# Patient Record
Sex: Male | Born: 1955 | Race: White | Hispanic: No | Marital: Married
Health system: Midwestern US, Community
[De-identification: ages and names within clinical notes are randomized; demographics above are authoritative.]

## PROBLEM LIST (undated history)

## (undated) DIAGNOSIS — Z87438 Personal history of other diseases of male genital organs: Secondary | ICD-10-CM

## (undated) DIAGNOSIS — Z789 Other specified health status: Secondary | ICD-10-CM

## (undated) DIAGNOSIS — D171 Benign lipomatous neoplasm of skin and subcutaneous tissue of trunk: Secondary | ICD-10-CM

## (undated) HISTORY — PX: WRIST SURGERY: SHX841

## (undated) HISTORY — PX: KNEE SURGERY: SHX244

## (undated) HISTORY — PX: SHOULDER SURGERY: SHX246

## (undated) HISTORY — PX: TRANSURETHRAL RESECTION OF PROSTATE: SHX73

## (undated) HISTORY — PX: NO PAST SURGERIES: SHX2092

---

## 2017-04-02 ENCOUNTER — Emergency Department (HOSPITAL_COMMUNITY)
Admission: EM | Admit: 2017-04-02 | Discharge: 2017-04-02 | Disposition: A | Discharge status: Home / Self Care | Payer: Self-pay | Attending: Emergency Medicine | Admitting: Emergency Medicine

## 2017-04-02 ENCOUNTER — Encounter (HOSPITAL_COMMUNITY): Payer: Self-pay

## 2017-04-02 DIAGNOSIS — Z79899 Other long term (current) drug therapy: Secondary | ICD-10-CM | POA: Insufficient documentation

## 2017-04-02 DIAGNOSIS — M545 Low back pain, unspecified: Secondary | ICD-10-CM

## 2017-04-02 MED ORDER — CYCLOBENZAPRINE HCL 10 MG PO TABS: 10.0000 mg | ORAL_TABLET | Freq: Two times a day (BID) | ORAL | 0 refills | Status: DC | PRN

## 2017-04-02 NOTE — Discharge Instructions (Signed)
Please take Flexeril as needed for back pain and muscle spasm. Please take Tylenol at home for pain relief. Please follow up with a primary care provider using the financial resource guide attached. Please also follow up with neurosurgery regarding today's visit.  Get help right away if: You have weakness or numbness in one or both of your legs or feet. You have trouble controlling your bladder or your bowels. You have nausea or vomiting. You have pain in your abdomen. You have shortness of breath or you faint.

## 2017-04-02 NOTE — ED Triage Notes (Signed)
Pt states he has had ongoing right lower back x 2 years; pt states he has a "growth" there which has been growing;Pt states he was dx in [facility] and records at [facility]. pt states he has not  Had it checked in the past year; pt states sharp pain that does not allow him to get any sleep or sleep on the right side. Pt states pain 9/10 on arrival. Pt able to ambulate to triage; Pt a&ox 4

## 2017-04-02 NOTE — ED Notes (Signed)
PA at bedside.

## 2017-04-02 NOTE — ED Notes (Signed)
Patient able to ambulate independently  

## 2017-04-02 NOTE — ED Provider Notes (Signed)
[place] DEPT Provider Note   CSN: [id number] Arrival date & time: 04/02/17  1942  By signing my name below, I, Margit Banda, attest that this documentation has been prepared under the direction and in the presence of Berea Majkowski, [place]. Electronically Signed: Margit Banda, ED Scribe. 04/02/17. 9:56 PM.  History   Chief Complaint No chief complaint on file.   HPI Douglas Lewis is a 61 y.o. male with a PMHx chronic back pain who presents to the Emergency Department complaining of gradually worsening, sharp, constant, right lower back pain for the last couple of days. He rates pain a 9/10 severity and states it is deep and burning. Pt reports a growth on his back that has been increasing in size. It was first dx in [facility] ~ 2 years ago. He last had it examined over a year ago. Associated sx include sleep disturbance and right hand numbness. Took aleve with no relief. Movement exacerbates his pain. No hx of IV drug use. 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 No recent back injuries. Doesn't smoke. Pt denies fever, chills, nausea, vomiting, diarrhea, pain radiation, visual disturbance, dysuria, hematuria, saddle anesthesia, and bladder and bowel incontinence.  The history is provided by the patient. No language interpreter was used.    History reviewed. No pertinent past medical history.  There are no active problems to display for this patient.   History reviewed. No pertinent surgical history.     Home Medications    Prior to Admission medications   Medication Sig Start Date End Date Taking? Authorizing Provider  cyclobenzaprine (FLEXERIL) 10 MG tablet Take 1 tablet (10 mg total) by mouth 2 (two) times daily as needed for muscle spasms. 04/02/17   Bettey Costa, PA    Family History No family history on file.  Social History Social History  Substance Use Topics  . Smoking status: Never Smoker  . Smokeless tobacco:  Not on file  . Alcohol use No     Allergies   Patient has no allergy information on record.   Review of Systems Review of Systems  Constitutional: Negative for chills and fever.  Eyes: Negative for visual disturbance.  Gastrointestinal: Negative for diarrhea, nausea and vomiting.  Genitourinary: Negative for dysuria and hematuria.       - saddle anesthesia. - bladder and bowel incontinence.  Musculoskeletal: Positive for back pain.  Neurological: Positive for numbness (right hand).  Psychiatric/Behavioral: Positive for sleep disturbance.    Physical Exam Updated Vital Signs BP (!) 150/87 (BP Location: Right Arm)   Pulse 76   Temp 97.7 F (36.5 C) (Oral)   Resp 18   SpO2 94%   Physical Exam  Constitutional: He is oriented to person, place, and time. He appears well-developed and well-nourished.  Well appearing  HENT:  Head: Normocephalic and atraumatic.  Nose: Nose normal.  Mouth/Throat: Oropharynx is clear and moist.  Eyes: EOM are normal. Pupils are equal, round, and reactive to light.  Neck: Normal range of motion.  Normal ROM, no neck tenderness. No nuchal rigidity  Cardiovascular: Normal rate, normal heart sounds and intact distal pulses.   Pulmonary/Chest: Effort normal and breath sounds normal. No respiratory distress.  Normal work of breathing  Abdominal: Soft. There is no tenderness. There is no rebound and no guarding.  Soft and nontender. No rebound or guarding. No pulsatile mass noted.   Musculoskeletal: He exhibits tenderness. He exhibits no deformity.  There is  tenderness to right lower back. No midline cervical, thoracic, or lumbar tenderness. Good ROM of spine. No deformity. No obvious wound, redness, or swelling noted.  No mass palpated.   Neurological: He is alert and oriented to person, place, and time.  Cranial Nerves:  III,IV, VI: ptosis not present, extra-ocular movements intact bilaterally, direct and consensual pupillary light reflexes intact  bilaterally V: facial sensation, jaw opening, and bite strength equal bilaterally VII: eyebrow raise, eyelid close, smile, frown, pucker equal bilaterally VIII: hearing grossly normal bilaterally  IX,X: palate elevation and swallowing intact XI: bilateral shoulder shrug and lateral head rotation equal and strong XII: midline tongue extension  Negative pronator drift, negative Romberg, negative RAM's, negative heel-to-shin, negative finger to nose.    Sensory intact.  Muscle strength 5/5 Patient able to ambulate without difficulty.   SLR Right -  negative SLR Left -  negative  Skin: Skin is warm. Capillary refill takes less than 2 seconds.  Psychiatric: He has a normal mood and affect. His behavior is normal.  Nursing note and vitals reviewed.    ED Treatments / Results  DIAGNOSTIC STUDIES: Oxygen Saturation is 100% on RA, normal by my interpretation.   COORDINATION OF CARE: 9:56 PM-Discussed next steps with pt which includes following up with a PCP. He is also told to take a muscle relaxer and ibuprofen or aleve. Pt verbalized understanding and is agreeable with the plan.    Labs (all labs ordered are listed, but only abnormal results are displayed) Labs Reviewed - No data to display  EKG  EKG Interpretation None       Radiology No results found.  Procedures Procedures (including critical care time)  Medications Ordered in ED Medications - No data to display   Initial Impression / Assessment and Plan / ED Course  I have reviewed the triage vital signs and the nursing notes.  Pertinent labs & imaging results that were available during my care of the patient were reviewed by me and considered in my medical decision making (see chart for details).    Patient with back pain.  No neurological deficits and normal neuro exam.  Patient is ambulatory.  No loss of bowel or bladder control.  No concern for cauda equina.  No fever, night sweats, weight loss, h/o cancer,  IVDA, no recent procedure to back. No urinary symptoms suggestive of UTI.  Supportive care and return precaution discussed. Appears safe for discharge at this time. Follow up as indicated in discharge paperwork.    Final Clinical Impressions(s) / ED Diagnoses   Final diagnoses:  Acute right-sided low back pain without sciatica    New Prescriptions New Prescriptions   CYCLOBENZAPRINE (FLEXERIL) 10 MG TABLET    Take 1 tablet (10 mg total) by mouth 2 (two) times daily as needed for muscle spasms.   I personally performed the services described in this documentation, which was scribed in my presence. The recorded information has been reviewed and is accurate.    Flonnie Overman [place], [place] 04/02/17 2238    Drenda Freeze, MD 04/02/17 [phone]

## 2017-06-28 DIAGNOSIS — M5417 Radiculopathy, lumbosacral region: Secondary | ICD-10-CM | POA: Insufficient documentation

## 2017-06-29 ENCOUNTER — Emergency Department (HOSPITAL_COMMUNITY)
Admission: EM | Admit: 2017-06-29 | Discharge: 2017-06-29 | Disposition: A | Discharge status: Home / Self Care | Payer: Self-pay | Attending: Emergency Medicine | Admitting: Emergency Medicine

## 2017-06-29 ENCOUNTER — Emergency Department (HOSPITAL_COMMUNITY): Payer: Self-pay

## 2017-06-29 DIAGNOSIS — M5417 Radiculopathy, lumbosacral region: Secondary | ICD-10-CM

## 2017-06-29 MED ORDER — FENTANYL CITRATE (PF) 100 MCG/2ML IJ SOLN
50.0000 ug | Freq: Once | INTRAMUSCULAR | Status: AC
  Administered 2017-06-29: 50 ug via INTRAVENOUS
  Filled 2017-06-29: qty 2

## 2017-06-29 MED ORDER — CYCLOBENZAPRINE HCL 10 MG PO TABS: 10.0000 mg | ORAL_TABLET | Freq: Three times a day (TID) | ORAL | 0 refills | Status: DC | PRN

## 2017-06-29 MED ORDER — DEXAMETHASONE SODIUM PHOSPHATE 10 MG/ML IJ SOLN
10.0000 mg | Freq: Once | INTRAMUSCULAR | Status: AC
  Administered 2017-06-29: 10 mg via INTRAVENOUS
  Filled 2017-06-29: qty 1

## 2017-06-29 MED ORDER — OXYCODONE-ACETAMINOPHEN 5-325 MG PO TABS
1.0000 | ORAL_TABLET | Freq: Once | ORAL | Status: AC
  Administered 2017-06-29: 1 via ORAL
  Filled 2017-06-29: qty 1

## 2017-06-29 MED ORDER — OXYCODONE-ACETAMINOPHEN 5-325 MG PO TABS: 1.0000 | ORAL_TABLET | Freq: Three times a day (TID) | ORAL | 0 refills | Status: DC | PRN

## 2017-06-29 MED ORDER — KETOROLAC TROMETHAMINE 15 MG/ML IJ SOLN
15.0000 mg | Freq: Once | INTRAMUSCULAR | Status: AC
  Administered 2017-06-29: 15 mg via INTRAVENOUS
  Filled 2017-06-29: qty 1

## 2017-06-29 NOTE — Discharge Instructions (Signed)

## 2017-06-29 NOTE — ED Provider Notes (Signed)
[place] DEPT Provider Note   CSN: [id number] Arrival date & time: 06/28/17  2321     History   Chief Complaint Chief Complaint  Patient presents with  . Back Pain    HPI Drystan Reader is a 61 y.o. male.  The history is provided by the patient.  Back Pain   This is a new problem. The current episode started 3 to 5 hours ago. The problem occurs constantly. The problem has been gradually worsening. The pain is associated with lifting heavy objects. The pain is present in the lumbar spine. The pain radiates to the right thigh. The pain is severe. The symptoms are aggravated by certain positions. Pertinent negatives include no chest pain, no fever, no abdominal pain, no bowel incontinence, no bladder incontinence and no weakness. He has tried bed rest for the symptoms. The treatment provided no relief.  pt reports lifting trailer and had immediate onset of low back pain that radiates into right thigh He has had this previously No previous back surgery   PMH - none Soc hx - nonsmoker  Home Medications    Prior to Admission medications   Medication Sig Start Date End Date Taking? Authorizing Provider  cyclobenzaprine (FLEXERIL) 10 MG tablet Take 1 tablet (10 mg total) by mouth 2 (two) times daily as needed for muscle spasms. 04/02/17   Bettey Costa, PA    Family History No family history on file.  Social History Social History  Substance Use Topics  . Smoking status: Never Smoker  . Smokeless tobacco: Not on file  . Alcohol use No     Allergies   Patient has no known allergies.   Review of Systems Review of Systems  Constitutional: Negative for fever.  Cardiovascular: Negative for chest pain.  Gastrointestinal: Negative for abdominal pain and bowel incontinence.  Genitourinary: Negative for bladder incontinence.  Musculoskeletal: Positive for back pain.  Neurological: Negative for weakness.  All other systems reviewed and are  negative.    Physical Exam Updated Vital Signs BP (!) 145/101 (BP Location: Right Arm)   Pulse 71   Temp 98.8 F (37.1 C) (Oral)   Resp 18   Physical Exam  CONSTITUTIONAL: Well developed/well nourished HEAD: Normocephalic/atraumatic EYES: EOMI/PERRL ENMT: Mucous membranes moist NECK: supple no meningeal signs SPINE/BACK:entire spine nontender , lumbar paraspinal tenderness, No bruising/crepitance/stepoffs noted to spine CV: S1/S2 noted, no murmurs/rubs/gallops noted LUNGS: Lungs are clear to auscultation bilaterally, no apparent distress ABDOMEN: soft, nontender, no rebound or guarding GU:no cva tenderness NEURO: Awake/alert, equal motor 5/5 strength noted with the following: hip flexion/knee flexion/extension, foot dorsi/plantar flexion, great toe extension intact bilaterally,  no sensory deficit in any dermatome.  EXTREMITIES: pulses normal, full ROM SKIN: warm, color normal PSYCH: no abnormalities of mood noted, alert and oriented to situation   ED Treatments / Results  Labs (all labs ordered are listed, but only abnormal results are displayed) Labs Reviewed - No data to display  EKG  EKG Interpretation None       Radiology Dg Lumbar Spine Complete  Result Date: 06/29/2017 CLINICAL DATA:  Lumbosacral back pain after injury lifting a trailer onto a hitch. Pain radiates down right leg. EXAM: LUMBAR SPINE - COMPLETE 4+ VIEW COMPARISON:  None. FINDINGS: Straightening of normal lordosis. No listhesis. Vertebral body heights are normal. There is no listhesis. The posterior elements are intact. Disc space narrowing at L1-L2, L5-S1, and in the visualized lower thoracic spine. No fracture. Sacroiliac joints are congruent. IMPRESSION: 1. No fracture or  acute subluxation of the lumbar spine. 2. Straightening of normal lordosis can be seen with muscle spasm. 3. Mild degenerative disc disease. Electronically Signed   By: Jeb Levering M.D.   On: 06/29/2017 03:57     Procedures Procedures (including critical care time)  Medications Ordered in ED Medications  oxyCODONE-acetaminophen (PERCOCET/ROXICET) 5-325 MG per tablet 1 tablet (1 tablet Oral Given 06/29/17 0142)  ketorolac (TORADOL) 15 MG/ML injection 15 mg (15 mg Intravenous Given 06/29/17 0408)  fentaNYL (SUBLIMAZE) injection 50 mcg (50 mcg Intravenous Given 06/29/17 0416)  dexamethasone (DECADRON) injection 10 mg (10 mg Intravenous Given 06/29/17 0411)     Initial Impression / Assessment and Plan / ED Course  I have reviewed the triage vital signs and the nursing notes.  Pertinent   imaging results that were available during my care of the patient were reviewed by me and considered in my medical decision making (see chart for details).     Pt improved He is able to stand/ambulate Will d/c home Will need outpatient followup Suspect lumbosacral radiculopathy We discussed strict ER return precautions Narcotic database reviewed and considered in decision making   Final Clinical Impressions(s) / ED Diagnoses   Final diagnoses:  Lumbosacral radiculopathy    New Prescriptions New Prescriptions   CYCLOBENZAPRINE (FLEXERIL) 10 MG TABLET    Take 1 tablet (10 mg total) by mouth 3 (three) times daily as needed for muscle spasms.   OXYCODONE-ACETAMINOPHEN (PERCOCET) 5-325 MG TABLET    Take 1 tablet by mouth every 8 (eight) hours as needed for severe pain.     Ripley Fraise, MD 06/29/17 [phone]

## 2017-06-29 NOTE — ED Notes (Signed)
Patient transported to X-ray 

## 2017-06-29 NOTE — ED Triage Notes (Signed)
Pt reports injuried his back lifting trailer into place

## 2017-08-07 ENCOUNTER — Encounter (HOSPITAL_COMMUNITY): Payer: Self-pay

## 2017-08-07 ENCOUNTER — Emergency Department (HOSPITAL_COMMUNITY)
Admission: EM | Admit: 2017-08-07 | Discharge: 2017-08-07 | Disposition: A | Discharge status: Home / Self Care | Payer: Self-pay | Attending: Emergency Medicine | Admitting: Emergency Medicine

## 2017-08-07 DIAGNOSIS — R202 Paresthesia of skin: Secondary | ICD-10-CM | POA: Insufficient documentation

## 2017-08-07 DIAGNOSIS — Y929 Unspecified place or not applicable: Secondary | ICD-10-CM | POA: Insufficient documentation

## 2017-08-07 DIAGNOSIS — Y9389 Activity, other specified: Secondary | ICD-10-CM | POA: Insufficient documentation

## 2017-08-07 DIAGNOSIS — Z79899 Other long term (current) drug therapy: Secondary | ICD-10-CM | POA: Insufficient documentation

## 2017-08-07 DIAGNOSIS — S50811A Abrasion of right forearm, initial encounter: Secondary | ICD-10-CM | POA: Insufficient documentation

## 2017-08-07 DIAGNOSIS — Z23 Encounter for immunization: Secondary | ICD-10-CM | POA: Insufficient documentation

## 2017-08-07 DIAGNOSIS — W228XXA Striking against or struck by other objects, initial encounter: Secondary | ICD-10-CM | POA: Insufficient documentation

## 2017-08-07 DIAGNOSIS — Y999 Unspecified external cause status: Secondary | ICD-10-CM | POA: Insufficient documentation

## 2017-08-07 MED ORDER — TETANUS-DIPHTH-ACELL PERTUSSIS 5-2.5-18.5 LF-MCG/0.5 IM SUSP
0.5000 mL | Freq: Once | INTRAMUSCULAR | Status: AC
  Administered 2017-08-07: 0.5 mL via INTRAMUSCULAR
  Filled 2017-08-07: qty 0.5

## 2017-08-07 NOTE — ED Provider Notes (Signed)
[facility] DEPT Provider Note   CSN: [id number] Arrival date & time: 08/07/17  1947     History   Chief Complaint Chief Complaint  Patient presents with  . Numbness  . Abrasion    HPI Douglas Lewis is a 61 y.o. male.  HPI   Douglas Lewis is a 61 year old male with no significant past medical history who presents the emergency department for evaluation of right arm tingling. Patient states that he was in the bathroom at [facility] when he hit the top of his forearm on a metal coat hanger several hours ago. He states that the hit broke the skin, had some mild bleeding from the area which has since stopped with holding pressure. The tingling goes from his right elbow to the MCP joint of the right hand. He notes that the tingling was Lewis severe at the time of the injury, and has since improved with only mild tingling noted at this time. He denies numbness, weakness, dysarthria, diplopia, headache. He does not remember when his tetanus shot was last updated.   No past medical history on file.  There are no active problems to display for this patient.   No past surgical history on file.     Home Medications    Prior to Admission medications   Medication Sig Start Date End Date Taking? Authorizing Provider  cyclobenzaprine (FLEXERIL) 10 MG tablet Take 1 tablet (10 mg total) by mouth 3 (three) times daily as needed for muscle spasms. 06/29/17   Ripley Fraise, MD  oxyCODONE-acetaminophen (PERCOCET) 5-325 MG tablet Take 1 tablet by mouth every 8 (eight) hours as needed for severe pain. 06/29/17   Ripley Fraise, MD    Family History No family history on file.  Social History Social History  Substance Use Topics  . Smoking status: Never Smoker  . Smokeless tobacco: Not on file  . Alcohol use No     Allergies   Patient has no known allergies.   Review of Systems Review of Systems  Musculoskeletal: Negative for arthralgias and joint swelling.  Skin: Positive for wound  (right forearm, no bleeding at this time). Negative for rash.  Neurological: Negative for dizziness, weakness, light-headedness, numbness (states that he has "tingling" sensation in his right forearm.) and headaches.     Physical Exam Updated Vital Signs BP (!) 139/91   Pulse 75   Temp 98.3 F (36.8 C) (Oral)   Resp 16   Ht 6' (1.829 m)   Wt 105.7 kg (233 lb)   SpO2 100%   BMI 31.60 kg/m   Physical Exam  Constitutional: He is oriented to person, place, and time. He appears well-developed and well-nourished. No distress.  HENT:  Head: Normocephalic and atraumatic.  Eyes: Right eye exhibits no discharge. Left eye exhibits no discharge.  Pulmonary/Chest: Effort normal. No respiratory distress.  Musculoskeletal:  No tenderness to palpation over the right wrist, elbow. No tenderness over the right forearm. Full ROM of the right hand, wrist, elbow.  Neurological: He is alert and oriented to person, place, and time. Coordination normal.  Distal sensation to the radial, median and ulnar nerve intact to light/sharp touch in bilateral extremities. Strength 5/5 in bilateral UE and LE. Extensor muscle of the forearm noted to be fasciculating during the exam. Fasciculating comes and goes.  Mental Status:  Alert, oriented, thought content appropriate, able to give a coherent history. Speech fluent without evidence of aphasia. Able to follow 2 step commands without difficulty.  Cranial Nerves:  II:  Peripheral visual fields grossly normal, pupils equal, round, reactive to light III,IV, VI: ptosis not present, extra-ocular motions intact bilaterally  V,VII: smile symmetric, facial light touch sensation equal VIII: hearing grossly normal to voice  X: uvula elevates symmetrically  XI: bilateral shoulder shrug symmetric and strong XII: midline tongue extension without fassiculations Deep Tendon Reflexes: 2+ and symmetric in the biceps and patella Cerebellar: normal finger-to-nose with bilateral  upper extremities Gait: normal gait and balance  Skin: Skin is warm and dry. He is not diaphoretic.  See picture below for wound on right forearm. No active bleeding.  Psychiatric: He has a normal mood and affect. His behavior is normal.  Nursing note and vitals reviewed.       ED Treatments / Results  Labs (all labs ordered are listed, but only abnormal results are displayed) Labs Reviewed - No data to display  EKG  EKG Interpretation None       Radiology No results found.  Procedures Procedures (including critical care time)  Medications Ordered in ED Medications  Tdap (BOOSTRIX) injection 0.5 mL (0.5 mLs Intramuscular Given 08/07/17 2132)     Initial Impression / Assessment and Plan / ED Course  I have reviewed the triage vital signs and the nursing notes.  Pertinent labs & imaging results that were available during my care of the patient were reviewed by me and considered in my medical decision making (see chart for details).     Patient with tingling noted in his right forearm, likely mild neuropraxia of the radial nerve due to hitting the arm earlier today. Patient states that his symptoms of tingling have improved since the initial injury. Sensation intact, and patient has full strength of the right upper extremity. No focal neurological deficits on exam to suggest stroke.  No tenderness to palpation of the elbow, forearm or wrist. Will not proceed with x-rays at this time given exam.  Wound is closed and does not need stitches. Tetanus shot updated in the ER today. Patient counseled on return precautions including numbness, weakness or worsening paresthesias. Patient is mildly hypertensive in the ER today, given information to establish care at [facility] for recheck. Patient's vital signs are stable. Patient voices understanding to the above plan and agrees to discharge.  Final Clinical Impressions(s) / ED Diagnoses   Final diagnoses:  Abrasion of right  forearm, initial encounter  Paresthesia of arm    New Prescriptions Discharge Medication List as of 08/07/2017  9:29 PM       Glyn Ade, PA-C 08/08/17 Herold Harms, MD 08/08/17 2104

## 2017-08-07 NOTE — Discharge Instructions (Signed)
You have a feeling of what we call paresthesias in your right forearm. This is due to you hitting your arm and irritating the nerve.  Please elevate the arm, apply ice over the wound, and take ibuprofen for pain and tingling. This sensation will slowly improve over time.  I have given him information to follow up and establish care with [facility], across the street from [facility]. They accept patients who do not have insurance. Your blood pressure was mildly elevated today, please have this rechecked.  Please return to the emergency department if you develop numbness, or unable to move the arm or have new or worsening symptoms

## 2017-08-07 NOTE — ED Triage Notes (Signed)
Patient reports approx 1900, he was at [facility] and used the restroom. Patient states he "turned around to turn on the light in the bathroom and my arm hit that metal coat hanger thingy sticking out of the door. It felt like a jack hammer hit my arm, and it broke the skin and caused my arm to bleed." Small abrasion noted to patient's right forearm- bandage in place, no bleeding at this time. Patient denies blood thinner use. Patient reports he got in his car to go home and "my arm felt tingly and numb, so I came to the ER." Equal grips in triage. Negative stroke screen. Patient reports unsure of last tetanus shot.

## 2018-01-29 ENCOUNTER — Emergency Department (HOSPITAL_COMMUNITY): Payer: Self-pay

## 2018-01-29 ENCOUNTER — Encounter (HOSPITAL_COMMUNITY): Payer: Self-pay | Admitting: Emergency Medicine

## 2018-01-29 DIAGNOSIS — J449 Chronic obstructive pulmonary disease, unspecified: Secondary | ICD-10-CM | POA: Insufficient documentation

## 2018-01-29 DIAGNOSIS — J40 Bronchitis, not specified as acute or chronic: Secondary | ICD-10-CM | POA: Insufficient documentation

## 2018-01-29 LAB — CBC WITH DIFFERENTIAL/PLATELET
Basophils Absolute: 0.1 10*3/uL (ref 0.0–0.1)
Basophils Relative: 1 %
Eosinophils Absolute: 0.2 10*3/uL (ref 0.0–0.7)
Eosinophils Relative: 2 %
HEMATOCRIT: 46.2 % (ref 39.0–52.0)
HEMOGLOBIN: 14.5 g/dL (ref 13.0–17.0)
LYMPHS ABS: 2.2 10*3/uL (ref 0.7–4.0)
Lymphocytes Relative: 24 %
MCH: 27.8 pg (ref 26.0–34.0)
MCHC: 31.4 g/dL (ref 30.0–36.0)
MCV: 88.7 fL (ref 78.0–100.0)
MONO ABS: 0.5 10*3/uL (ref 0.1–1.0)
MONOS PCT: 5 %
NEUTROS ABS: 6.1 10*3/uL (ref 1.7–7.7)
NEUTROS PCT: 68 %
Platelets: 190 10*3/uL (ref 150–400)
RBC: 5.21 MIL/uL (ref 4.22–5.81)
RDW: 14.7 % (ref 11.5–15.5)
WBC: 9 10*3/uL (ref 4.0–10.5)

## 2018-01-29 LAB — BASIC METABOLIC PANEL
Anion gap: 7 (ref 5–15)
BUN: 13 mg/dL (ref 6–20)
CHLORIDE: 107 mmol/L (ref 101–111)
CO2: 24 mmol/L (ref 22–32)
CREATININE: 1.03 mg/dL (ref 0.61–1.24)
Calcium: 8.7 mg/dL — ABNORMAL LOW (ref 8.9–10.3)
GFR calc Af Amer: >60 mL/min (ref >60)
GFR calc non Af Amer: >60 mL/min (ref >60)
GLUCOSE: 101 mg/dL — AB (ref 65–99)
Potassium: 4.3 mmol/L (ref 3.5–5.1)
Sodium: 138 mmol/L (ref 135–145)

## 2018-01-29 NOTE — ED Triage Notes (Signed)
Pt c/o of coughing w/ conjestion, cold chills, HA, low energy, always being tired, not being able to sleep, and loss of appetite x 10 days. As well as his ears hurting when he coughs. Pt recently moved from [place] and states " I should have stayed in [place]".  Pt has tried Mucinex, Pseudofed, OTC cough syrup, and naproxen w/ no relief. Pt A&Ox4, and ambulatory.

## 2018-01-30 ENCOUNTER — Emergency Department (HOSPITAL_COMMUNITY)
Admission: EM | Admit: 2018-01-30 | Discharge: 2018-01-30 | Disposition: A | Discharge status: Home / Self Care | Payer: Self-pay | Attending: Emergency Medicine | Admitting: Emergency Medicine

## 2018-01-30 DIAGNOSIS — J40 Bronchitis, not specified as acute or chronic: Secondary | ICD-10-CM

## 2018-01-30 MED ORDER — AZITHROMYCIN 250 MG PO TABS: 250.0000 mg | ORAL_TABLET | Freq: Every day | ORAL | 0 refills | Status: DC

## 2018-01-30 MED ORDER — BENZONATATE 100 MG PO CAPS
200.0000 mg | ORAL_CAPSULE | Freq: Once | ORAL | Status: AC
  Administered 2018-01-30: 200 mg via ORAL
  Filled 2018-01-30: qty 2

## 2018-01-30 MED ORDER — ALBUTEROL SULFATE HFA 108 (90 BASE) MCG/ACT IN AERS
2.0000 | INHALATION_SPRAY | Freq: Once | RESPIRATORY_TRACT | Status: AC
  Administered 2018-01-30: 2 via RESPIRATORY_TRACT
  Filled 2018-01-30: qty 6.7

## 2018-01-30 MED ORDER — PREDNISONE 20 MG PO TABS: 40.0000 mg | ORAL_TABLET | Freq: Every day | ORAL | 0 refills | Status: DC

## 2018-01-30 MED ORDER — PREDNISONE 20 MG PO TABS
60.0000 mg | ORAL_TABLET | Freq: Once | ORAL | Status: AC
Start: 2018-01-30 — End: 2018-01-30
  Administered 2018-01-30: 60 mg via ORAL
  Filled 2018-01-30: qty 3

## 2018-01-30 MED ORDER — BENZONATATE 100 MG PO CAPS: 100.0000 mg | ORAL_CAPSULE | Freq: Three times a day (TID) | ORAL | 0 refills | Status: DC

## 2018-01-30 MED ORDER — AZITHROMYCIN 250 MG PO TABS
500.0000 mg | ORAL_TABLET | Freq: Once | ORAL | Status: AC
  Administered 2018-01-30: 500 mg via ORAL
  Filled 2018-01-30: qty 2

## 2018-01-30 NOTE — Discharge Instructions (Addendum)
Use inhaler 2 puffs every 4 hours.  Take prednisone as prescribed until all gone.  Take Zithromax as prescribed until all gone.  Tessalon as needed for cough.  Follow-up with family doctor as needed.  Return if worsening

## 2018-01-30 NOTE — ED Provider Notes (Signed)
[facility] EMERGENCY DEPARTMENT Provider Note   CSN: [id number] Arrival date & time: 01/29/18  9379     History   Chief Complaint Chief Complaint  Patient presents with  . Cough    HPI Douglas Lewis is a 62 y.o. male.  HPI Douglas Lewis is a 62 y.o. male otherwise healthy, presents to emergency department complaining of nasal congestion, cough, wheezing.  Patient states he has had symptoms for 10 days.  He states that the cough is keeping him up and he is unable to sleep at night.  He states he does have some pain in his chest from coughing.  He denies any fever.  He denies any shortness of breath.  He states he is wheezing.  He has been using his girlfriend's inhaler which is not helping.  He also tried Mucinex, Sudafed, cough syrup and naproxen.  Patient states "I think I have walking pneumonia."  He denies any cardiac history.  He denies any exertional chest pain or shortness of breath.  He denies any swelling in extremities.  History reviewed. No pertinent past medical history.  There are no active problems to display for this patient.   History reviewed. No pertinent surgical history.      Home Medications    Prior to Admission medications   Medication Sig Start Date End Date Taking? Authorizing Provider  cyclobenzaprine (FLEXERIL) 10 MG tablet Take 1 tablet (10 mg total) by mouth 3 (three) times daily as needed for muscle spasms. 06/29/17   Ripley Fraise, MD  oxyCODONE-acetaminophen (PERCOCET) 5-325 MG tablet Take 1 tablet by mouth every 8 (eight) hours as needed for severe pain. 06/29/17   Ripley Fraise, MD    Family History No family history on file.  Social History Social History   Tobacco Use  . Smoking status: Never Smoker  . Smokeless tobacco: Never Used  Substance Use Topics  . Alcohol use: No  . Drug use: Not on file     Allergies   Patient has no known allergies.   Review of Systems Review of Systems  Constitutional:  Positive for fatigue. Negative for chills and fever.  HENT: Positive for congestion.   Respiratory: Positive for cough and wheezing. Negative for chest tightness and shortness of breath.   Cardiovascular: Negative for chest pain, palpitations and leg swelling.  Gastrointestinal: Negative for abdominal distention, abdominal pain, diarrhea, nausea and vomiting.  Genitourinary: Negative for dysuria, frequency, hematuria and urgency.  Musculoskeletal: Negative for arthralgias, myalgias, neck pain and neck stiffness.  Skin: Negative for rash.  Allergic/Immunologic: Negative for immunocompromised state.  Neurological: Positive for weakness. Negative for dizziness, light-headedness, numbness and headaches.  All other systems reviewed and are negative.    Physical Exam Updated Vital Signs BP 119/87 (BP Location: Right Arm)   Pulse 78   Temp 98.3 F (36.8 C) (Oral)   Resp 16   Ht 6' (1.829 m)   Wt 99.8 kg (220 lb)   SpO2 97%   BMI 29.84 kg/m   Physical Exam  Constitutional: He is oriented to person, place, and time. He appears well-developed and well-nourished. No distress.  HENT:  Head: Normocephalic and atraumatic.  Right Ear: External ear normal.  Left Ear: External ear normal.  Mouth/Throat: Oropharynx is clear and moist.  Clear rhinorrhea, pharynx erythemous, uvula midline  Eyes: Conjunctivae are normal.  Neck: Normal range of motion. Neck supple.  No meningeal signs  Cardiovascular: Normal rate, regular rhythm and normal heart sounds.  Pulmonary/Chest: Effort normal.  No respiratory distress. He has wheezes. He has no rales.  Inspiratory and expiratory wheezes bilaterally  Abdominal: Soft. Bowel sounds are normal. There is no tenderness.  Musculoskeletal: He exhibits no edema or tenderness.  Lymphadenopathy:    He has no cervical adenopathy.  Neurological: He is alert and oriented to person, place, and time.  Skin: Skin is warm and dry. No erythema.  Psychiatric: He has a  normal mood and affect.  Nursing note and vitals reviewed.    ED Treatments / Results  Labs (all labs ordered are listed, but only abnormal results are displayed) Labs Reviewed  BASIC METABOLIC PANEL - Abnormal; Notable for the following components:      Result Value   Glucose, Bld 101 (*)    Calcium 8.7 (*)    All other components within normal limits  CBC WITH DIFFERENTIAL/PLATELET    EKG None  Radiology Dg Chest 2 View  Result Date: 01/29/2018 CLINICAL DATA:  Dyspnea EXAM: CHEST - 2 VIEW COMPARISON:  None. FINDINGS: Normal heart size. Normal mediastinal contour. No pneumothorax. No pleural effusion. Lungs appear clear, with no acute consolidative airspace disease and no pulmonary edema. IMPRESSION: No active cardiopulmonary disease. Electronically Signed   By: Ilona Sorrel M.D.   On: 01/29/2018 21:00    Procedures Procedures (including critical care time)  Medications Ordered in ED Medications - No data to display   Initial Impression / Assessment and Plan / ED Course  I have reviewed the triage vital signs and the nursing notes.  Pertinent labs & imaging results that were available during my care of the patient were reviewed by me and considered in my medical decision making (see chart for details).     Patient in emergency department with cough and wheezing.  Symptoms for 10 days.  His CBC and Bement are normal.  His chest x-ray is clear.  Vital signs are all within normal.  Patient requesting antibiotics.  We will treat him with an inhaler, Z-Pak, Tessalon, prednisone for wheezing.  Patient is otherwise healthy.  Stable for discharge home.  We will have him follow-up with family doctor as needed.  Return precautions discussed.  Vitals:   01/29/18 1952 01/29/18 2004 01/29/18 2212  BP: 123/84  119/87  Pulse: 78  78  Resp: 18  16  Temp: 98.3 F (36.8 C)    TempSrc: Oral    SpO2: 96%  97%  Weight: 99.8 kg (220 lb) 99.8 kg (220 lb)   Height: 6' (1.829 m) 6' (1.829  m)      Final Clinical Impressions(s) / ED Diagnoses   Final diagnoses:  Bronchitis    ED Discharge Orders        Ordered    azithromycin (ZITHROMAX) 250 MG tablet  Daily     01/30/18 0111    benzonatate (TESSALON) 100 MG capsule  Every 8 hours     01/30/18 0111    predniSONE (DELTASONE) 20 MG tablet  Daily     01/30/18 0111       Jeannett Senior, PA-C 01/30/18 0112    Varney Biles, MD 01/31/18 [phone]

## 2018-04-05 ENCOUNTER — Encounter (HOSPITAL_COMMUNITY): Payer: Self-pay

## 2018-04-05 ENCOUNTER — Other Ambulatory Visit: Payer: Self-pay

## 2018-04-05 ENCOUNTER — Emergency Department (HOSPITAL_COMMUNITY)
Admission: EM | Admit: 2018-04-05 | Discharge: 2018-04-06 | Disposition: A | Discharge status: Home / Self Care | Payer: Self-pay | Attending: Emergency Medicine | Admitting: Emergency Medicine

## 2018-04-05 ENCOUNTER — Emergency Department (HOSPITAL_COMMUNITY): Payer: Self-pay

## 2018-04-05 DIAGNOSIS — R059 Cough, unspecified: Secondary | ICD-10-CM

## 2018-04-05 DIAGNOSIS — R079 Chest pain, unspecified: Secondary | ICD-10-CM | POA: Insufficient documentation

## 2018-04-05 DIAGNOSIS — R062 Wheezing: Secondary | ICD-10-CM | POA: Insufficient documentation

## 2018-04-05 DIAGNOSIS — R05 Cough: Secondary | ICD-10-CM | POA: Insufficient documentation

## 2018-04-05 LAB — CBC WITH DIFFERENTIAL/PLATELET
ABS IMMATURE GRANULOCYTES: 0 10*3/uL (ref 0.0–0.1)
Basophils Absolute: 0 10*3/uL (ref 0.0–0.1)
Basophils Relative: 0 %
EOS ABS: 0 10*3/uL (ref 0.0–0.7)
Eosinophils Relative: 0 %
HEMATOCRIT: 50.1 % (ref 39.0–52.0)
HEMOGLOBIN: 16.2 g/dL (ref 13.0–17.0)
IMMATURE GRANULOCYTES: 0 %
LYMPHS ABS: 0.6 10*3/uL — AB (ref 0.7–4.0)
LYMPHS PCT: 6 %
MCH: 27.6 pg (ref 26.0–34.0)
MCHC: 32.3 g/dL (ref 30.0–36.0)
MCV: 85.5 fL (ref 78.0–100.0)
Monocytes Absolute: 0.1 10*3/uL (ref 0.1–1.0)
Monocytes Relative: 1 %
NEUTROS ABS: 9 10*3/uL — AB (ref 1.7–7.7)
NEUTROS PCT: 93 %
Platelets: 181 10*3/uL (ref 150–400)
RBC: 5.86 MIL/uL — AB (ref 4.22–5.81)
RDW: 14.3 % (ref 11.5–15.5)
WBC: 9.7 10*3/uL (ref 4.0–10.5)

## 2018-04-05 LAB — I-STAT CHEM 8, ED
BUN: 7 mg/dL (ref 6–20)
CREATININE: 0.8 mg/dL (ref 0.61–1.24)
Calcium, Ion: 1.11 mmol/L — ABNORMAL LOW (ref 1.15–1.40)
Chloride: 104 mmol/L (ref 101–111)
Glucose, Bld: 165 mg/dL — ABNORMAL HIGH (ref 65–99)
HEMATOCRIT: 50 % (ref 39.0–52.0)
HEMOGLOBIN: 17 g/dL (ref 13.0–17.0)
POTASSIUM: 3.6 mmol/L (ref 3.5–5.1)
Sodium: 140 mmol/L (ref 135–145)
TCO2: 23 mmol/L (ref 22–32)

## 2018-04-05 MED ORDER — ACETAMINOPHEN 500 MG PO TABS
1000.0000 mg | ORAL_TABLET | Freq: Once | ORAL | Status: AC
  Administered 2018-04-05: 1000 mg via ORAL
  Filled 2018-04-05: qty 2

## 2018-04-05 MED ORDER — DOXYCYCLINE HYCLATE 100 MG PO CAPS: 100.0000 mg | ORAL_CAPSULE | Freq: Two times a day (BID) | ORAL | 0 refills | Status: DC

## 2018-04-05 MED ORDER — BENZONATATE 100 MG PO CAPS
100.0000 mg | ORAL_CAPSULE | Freq: Once | ORAL | Status: AC
  Administered 2018-04-05: 100 mg via ORAL
  Filled 2018-04-05: qty 1

## 2018-04-05 MED ORDER — PREDNISONE 20 MG PO TABS
60.0000 mg | ORAL_TABLET | Freq: Once | ORAL | Status: AC
  Administered 2018-04-05: 60 mg via ORAL
  Filled 2018-04-05: qty 3

## 2018-04-05 MED ORDER — IPRATROPIUM BROMIDE 0.02 % IN SOLN
0.5000 mg | Freq: Once | RESPIRATORY_TRACT | Status: AC
  Administered 2018-04-05: 0.5 mg via RESPIRATORY_TRACT
  Filled 2018-04-05: qty 2.5

## 2018-04-05 MED ORDER — ALBUTEROL SULFATE (2.5 MG/3ML) 0.083% IN NEBU
5.0000 mg | INHALATION_SOLUTION | Freq: Once | RESPIRATORY_TRACT | Status: AC
  Administered 2018-04-05: 5 mg via RESPIRATORY_TRACT
  Filled 2018-04-05: qty 6

## 2018-04-05 MED ORDER — ALBUTEROL SULFATE (2.5 MG/3ML) 0.083% IN NEBU
5.0000 mg | INHALATION_SOLUTION | Freq: Once | RESPIRATORY_TRACT | Status: AC
  Administered 2018-04-05: 5 mg via RESPIRATORY_TRACT

## 2018-04-05 MED ORDER — IBUPROFEN 800 MG PO TABS
800.0000 mg | ORAL_TABLET | Freq: Once | ORAL | Status: AC
  Administered 2018-04-05: 800 mg via ORAL
  Filled 2018-04-05: qty 1

## 2018-04-05 MED ORDER — ALBUTEROL (5 MG/ML) CONTINUOUS INHALATION SOLN
10.0000 mg/h | INHALATION_SOLUTION | RESPIRATORY_TRACT | Status: AC
  Administered 2018-04-05: 10 mg/h via RESPIRATORY_TRACT
  Filled 2018-04-05: qty 20

## 2018-04-05 MED ORDER — AEROCHAMBER PLUS FLO-VU LARGE MISC
Status: AC
  Administered 2018-04-06
  Filled 2018-04-05: qty 1

## 2018-04-05 MED ORDER — KETOROLAC TROMETHAMINE 15 MG/ML IJ SOLN
15.0000 mg | Freq: Once | INTRAMUSCULAR | Status: AC
  Administered 2018-04-05: 15 mg via INTRAVENOUS
  Filled 2018-04-05: qty 1

## 2018-04-05 MED ORDER — ALBUTEROL SULFATE HFA 108 (90 BASE) MCG/ACT IN AERS: 1.0000 | INHALATION_SPRAY | Freq: Four times a day (QID) | RESPIRATORY_TRACT | 0 refills | Status: DC | PRN

## 2018-04-05 MED ORDER — SODIUM CHLORIDE 0.9 % IV BOLUS
1000.0000 mL | Freq: Once | INTRAVENOUS | Status: AC
  Administered 2018-04-05: 1000 mL via INTRAVENOUS

## 2018-04-05 MED ORDER — BENZONATATE 100 MG PO CAPS: 100.0000 mg | ORAL_CAPSULE | Freq: Three times a day (TID) | ORAL | 0 refills | Status: DC

## 2018-04-05 MED ORDER — ALBUTEROL SULFATE HFA 108 (90 BASE) MCG/ACT IN AERS
2.0000 | INHALATION_SPRAY | Freq: Once | RESPIRATORY_TRACT | Status: AC
  Administered 2018-04-05: 2 via RESPIRATORY_TRACT
  Filled 2018-04-05: qty 6.7

## 2018-04-05 NOTE — Discharge Instructions (Signed)
Take the prescribed medication as directed. Follow-up with the patient care center-- they will be closed the weekend but call first thing Monday to get an appt. Return to the ED for new or worsening symptoms.

## 2018-04-05 NOTE — ED Provider Notes (Signed)
Patient placed in Quick Look pathway, seen and evaluated   Chief Complaint: Cough, nasal congestion  HPI:   Patient presents with complaint of progressively worsening cough and nasal congestion.  He was seen and evaluated for the same in March and was diagnosed with bronchitis discharged with azithromycin and prednisone and albuterol.  He states that he has had no improvement in his symptoms and they have been persistent since mid March.  He notes worsening productive cough, nasal congestion.  He does endorse chest pain with cough "all over ".   Also notes shortness of breath which worsens when laying flat.  Denies fevers or chills.  Occasionally will take ibuprofen but has not tried anything else for his symptoms and states "I cannot afford anything ".  ROS: Positive for nasal congestion, cough, wheezing, chest pain, shortness of breath, negative for fever, leg swelling  Physical Exam:   Gen: No distress  Neuro: Awake and Alert  Skin: Warm    Focused Exam: Diffuse expiratory wheezes and rhonchi noted on auscultation of the lungs.  Equal rise and fall of chest, no increased work of breathing, speaking full sentences without difficulty.  Diffuse tenderness to palpation of the chest wall anteriorly, posteriorly, and laterally with no deformity, crepitus, ecchymosis, or flail segment noted.  Heart rate and rhythm are regular, no murmurs rubs or gallops noted,  2+ radial and DP/PT pulses bilaterally   Initiation of care has begun. The patient has been counseled on the process, plan, and necessity for staying for the completion/evaluation, and the remainder of the medical screening examination    Renita Papa, PA-C 04/05/18 [place], Kevin, MD 04/05/18 [phone]

## 2018-04-05 NOTE — ED Provider Notes (Signed)
[facility] EMERGENCY DEPARTMENT Provider Note   CSN: [id number] Arrival date & time: 04/05/18  1130     History   Chief Complaint Chief Complaint  Patient presents with  . cough/CP    HPI Douglas Lewis is a 62 y.o. male.  Patient is a 62 year old male without significant medical history presenting today with ongoing cough, congestion, wheezing this started approximately 6 to 8 weeks ago.  Patient states that initially started as a URI and flulike symptoms.  He was seen in the emergency room and given a Z-Pak.  He states however symptoms did not improve and then he returned to the emergency room and was given prednisone and an inhaler.  He finished the prednisone and has gone through 2 separate albuterol inhalers and states the cough is not getting any better he is unable to sleep at night resulting in the cough.  He states the cough is productive with clear and green phlegm.  He denies any itchy or watery eyes, rhinorrhea or postnasal drip.  He has not had fever.  He denies significant shortness of breath.  No prior history of tobacco use.  No history of COPD or asthma.  Patient denies any lower extremity swelling or unilateral leg pain.  He states he returned today because he just could not take it anymore and he wants to get some rest.  The history is provided by the patient.    History reviewed. No pertinent past medical history.  There are no active problems to display for this patient.   History reviewed. No pertinent surgical history.      Home Medications    Prior to Admission medications   Medication Sig Start Date End Date Taking? Authorizing Provider  azithromycin (ZITHROMAX) 250 MG tablet Take 1 tablet (250 mg total) by mouth daily. Take first 2 tablets together, then 1 every day until finished. 01/30/18   Kirichenko, Tatyana, PA-C  benzonatate (TESSALON) 100 MG capsule Take 1 capsule (100 mg total) by mouth every 8 (eight) hours. 01/30/18    Kirichenko, Lahoma Rocker, PA-C  cyclobenzaprine (FLEXERIL) 10 MG tablet Take 1 tablet (10 mg total) by mouth 3 (three) times daily as needed for muscle spasms. 06/29/17   Ripley Fraise, MD  oxyCODONE-acetaminophen (PERCOCET) 5-325 MG tablet Take 1 tablet by mouth every 8 (eight) hours as needed for severe pain. 06/29/17   Ripley Fraise, MD  predniSONE (DELTASONE) 20 MG tablet Take 2 tablets (40 mg total) by mouth daily. 01/30/18   Jeannett Senior, PA-C    Family History No family history on file.  Social History Social History   Tobacco Use  . Smoking status: Never Smoker  . Smokeless tobacco: Never Used  Substance Use Topics  . Alcohol use: No  . Drug use: Not on file     Allergies   Patient has no known allergies.   Review of Systems Review of Systems  All other systems reviewed and are negative.    Physical Exam Updated Vital Signs BP (!) 156/78   Pulse 69   Temp 97.6 F (36.4 C) (Oral)   Resp 17   SpO2 99%   Physical Exam  Constitutional: He is oriented to person, place, and time. He appears well-developed and well-nourished. No distress.  HENT:  Head: Normocephalic and atraumatic.  Mouth/Throat: Oropharynx is clear and moist.  Eyes: Pupils are equal, round, and reactive to light. Conjunctivae and EOM are normal.  Neck: Normal range of motion. Neck supple.  Cardiovascular: Normal rate, regular  rhythm and intact distal pulses.  No murmur heard. Pulmonary/Chest: Effort normal. No respiratory distress. He has wheezes. He has no rales. He exhibits tenderness.  Tenderness with palpation on the left side of the ribs  Abdominal: Soft. He exhibits no distension. There is no tenderness. There is no rebound and no guarding.  Musculoskeletal: Normal range of motion. He exhibits no edema or tenderness.  Neurological: He is alert and oriented to person, place, and time.  Skin: Skin is warm and dry. Capillary refill takes less than 2 seconds. No rash noted. No erythema.    Psychiatric: He has a normal mood and affect. His behavior is normal.  Nursing note and vitals reviewed.    ED Treatments / Results  Labs (all labs ordered are listed, but only abnormal results are displayed) Labs Reviewed  CBC WITH DIFFERENTIAL/PLATELET  I-STAT CHEM 8, ED    EKG EKG Interpretation  Date/Time:  Friday Apr 05 2018 11:56:30 EDT Ventricular Rate:  80 PR Interval:  150 QRS Duration: 100 QT Interval:  386 QTC Calculation: 445 R Axis:   56 Text Interpretation:  Normal sinus rhythm Normal ECG No previous tracing Confirmed by Blanchie Dessert [phone]) on 04/05/2018 8:32:25 PM   Radiology Dg Chest 2 View  Result Date: 04/05/2018 CLINICAL DATA:  Cough and congestion EXAM: CHEST - 2 VIEW COMPARISON:  January 29, 2018 FINDINGS: There is no edema or consolidation. The heart size and pulmonary vascularity are normal. No adenopathy. There is degenerative change in the thoracic spine. IMPRESSION: No edema or consolidation. Electronically Signed   By: Lowella Grip III M.D.   On: 04/05/2018 12:28    Procedures Procedures (including critical care time)  Medications Ordered in ED Medications  albuterol (PROVENTIL) (2.5 MG/3ML) 0.083% nebulizer solution 5 mg (has no administration in time range)  ipratropium (ATROVENT) nebulizer solution 0.5 mg (has no administration in time range)  sodium chloride 0.9 % bolus 1,000 mL (has no administration in time range)  ketorolac (TORADOL) 15 MG/ML injection 15 mg (has no administration in time range)  albuterol (PROVENTIL) (2.5 MG/3ML) 0.083% nebulizer solution 5 mg (5 mg Nebulization Given 04/05/18 1202)  predniSONE (DELTASONE) tablet 60 mg (60 mg Oral Given 04/05/18 1619)  acetaminophen (TYLENOL) tablet 1,000 mg (1,000 mg Oral Given 04/05/18 1619)     Initial Impression / Assessment and Plan / ED Course  I have reviewed the triage vital signs and the nursing notes.  Pertinent labs & imaging results that were available during my  care of the patient were reviewed by me and considered in my medical decision making (see chart for details).     Patient is a 62 year old male with no significant past medical history with symptoms that are most consistent with a bronchitis that is been ongoing for the last 6 to 8 weeks.  Patient is wheezing diffusely here but in no acute distress.  He is complaining of diffuse chest discomfort due to all the coughing.  He denies fever and patient's chest x-ray today is within normal limits.  Low suspicion for ACS, CHF or PE.  Patient has no history of tobacco abuse or asthma.   We will continue albuterol, Atrovent.  Patient already received 60 mg of prednisone and for quick look.  We will give IV fluids, Toradol and will check a CBC and i-STAT to ensure no other acute issues.  Final Clinical Impressions(s) / ED Diagnoses   Final diagnoses:  None    ED Discharge Orders    None  Blanchie Dessert, MD 04/06/18 2229

## 2018-04-05 NOTE — ED Notes (Signed)
Delay in lab draw,  Pt using urinal. 

## 2018-04-05 NOTE — ED Notes (Signed)
Gave pt a sandwich to eat.

## 2018-04-05 NOTE — ED Provider Notes (Signed)
Assumed care from Dr. Maryan Rued at shift change.  See prior notes for full H&P.  Briefly, 62 y.o. M here with cough/bronchitis for several weeks.  Continues coughing up green sputum.  Denies chest pain.  Plan:  Course of steroids, cough meds, doxycycline, inhaler  10:25 PM Patient reassessed--- still with fairly significant wheezing and wet cough on exam.  O2 sats remain stable on RA.  States he feels a little better, still far from baseline.  Will start on CAT, give dose of tessalon.\  CRITICAL CARE Performed by: Larene Pickett   Total critical care time: 35 minutes  Critical care time was exclusive of separately billable procedures and treating other patients.  Critical care was necessary to treat or prevent imminent or life-threatening deterioration.  Critical care was time spent personally by me on the following activities: development of treatment plan with patient and/or surrogate as well as nursing, discussions with consultants, evaluation of patient's response to treatment, examination of patient, obtaining history from patient or surrogate, ordering and performing treatments and interventions, ordering and review of laboratory studies, ordering and review of radiographic studies, pulse oximetry and re-evaluation of patient's condition.   11:35 PM Patient with significant improvement after CAT.  Cough suppressed with tessalon.  VS remain stable on RA.  Feel he is stable for d/c.  Does not currently have PCP, will refer to wellness clinic for ongoing care.  Discussed plan with patient, he acknowledged understanding and agreed with plan of care.  Return precautions given for new or worsening symptoms.   Larene Pickett, PA-C 04/06/18 0324    Rex Kras Wenda Overland, MD 04/09/18 [phone]

## 2018-11-06 DIAGNOSIS — N135 Crossing vessel and stricture of ureter without hydronephrosis: Secondary | ICD-10-CM

## 2018-11-06 HISTORY — DX: Crossing vessel and stricture of ureter without hydronephrosis: N13.5

## 2019-06-01 ENCOUNTER — Encounter (HOSPITAL_COMMUNITY): Payer: Self-pay | Admitting: Emergency Medicine

## 2019-06-01 ENCOUNTER — Other Ambulatory Visit: Payer: Self-pay

## 2019-06-01 ENCOUNTER — Emergency Department (HOSPITAL_COMMUNITY)
Admission: EM | Admit: 2019-06-01 | Discharge: 2019-06-01 | Disposition: A | Discharge status: Home / Self Care | Payer: Self-pay | Attending: Emergency Medicine | Admitting: Emergency Medicine

## 2019-06-01 DIAGNOSIS — M7521 Bicipital tendinitis, right shoulder: Secondary | ICD-10-CM

## 2019-06-01 DIAGNOSIS — M6788 Other specified disorders of synovium and tendon, other site: Secondary | ICD-10-CM | POA: Insufficient documentation

## 2019-06-01 MED ORDER — OXYCODONE-ACETAMINOPHEN 5-325 MG PO TABS
1.0000 | ORAL_TABLET | Freq: Once | ORAL | Status: AC
  Administered 2019-06-01: 1 via ORAL
  Filled 2019-06-01: qty 1

## 2019-06-01 MED ORDER — OXYCODONE-ACETAMINOPHEN 5-325 MG PO TABS: 1.0000 | ORAL_TABLET | Freq: Three times a day (TID) | ORAL | 0 refills | Status: DC | PRN

## 2019-06-01 NOTE — Discharge Instructions (Signed)
Please follow-up with Dr. Tama Headings office by calling them tomorrow. Take the pain medication as needed and wear the sling as directed. Return to the ED if you start to have worsening symptoms, additional injury, worsening swelling or redness.

## 2019-06-01 NOTE — ED Triage Notes (Signed)
Patient with right upper arm pain.  States he lifted a 60lb bag of asphalt and felt a rip in his upper right arm.  He has been icing the muscle since Friday.

## 2019-06-01 NOTE — ED Provider Notes (Signed)
[place] EMERGENCY DEPARTMENT Provider Note   CSN: [id number] Arrival date & time: 06/01/19  2012    History   Chief Complaint Chief Complaint  Patient presents with  . Arm Injury    HPI Douglas Lewis is a 63 y.o. male who presents to ED for R arm pain that began on  05/30/2019. States that he was lifting a heavy 60lb bag when he felt a "ripping" sensation in his right upper arm.  States that the area has gotten progressively more painful despite icing it.  He has not tried any pain medications. Swelling has intermittently improved and then recurs.  He denies history of similar symptoms in the past.  He is had a history of rotator cuff surgery on bilateral shoulders.  Denies any changes to sensation, shortness of breath, fever, erythema.    HPI  History reviewed. No pertinent past medical history.  There are no active problems to display for this patient.   History reviewed. No pertinent surgical history.      Home Medications    Prior to Admission medications   Medication Sig Start Date End Date Taking? Authorizing Provider  albuterol (PROVENTIL HFA;VENTOLIN HFA) 108 (90 Base) MCG/ACT inhaler Inhale 1-2 puffs into the lungs every 6 (six) hours as needed for wheezing. 04/05/18   Larene Pickett, PA-C  azithromycin (ZITHROMAX) 250 MG tablet Take 1 tablet (250 mg total) by mouth daily. Take first 2 tablets together, then 1 every day until finished. Patient not taking: Reported on 04/05/2018 01/30/18   Jeannett Senior, PA-C  benzonatate (TESSALON) 100 MG capsule Take 1 capsule (100 mg total) by mouth every 8 (eight) hours. 04/05/18   Larene Pickett, PA-C  cyclobenzaprine (FLEXERIL) 10 MG tablet Take 1 tablet (10 mg total) by mouth 3 (three) times daily as needed for muscle spasms. Patient not taking: Reported on 04/05/2018 06/29/17   Ripley Fraise, MD  doxycycline (VIBRAMYCIN) 100 MG capsule Take 1 capsule (100 mg total) by mouth 2 (two) times daily.  04/05/18   Larene Pickett, PA-C  oxyCODONE-acetaminophen (PERCOCET/ROXICET) 5-325 MG tablet Take 1 tablet by mouth every 8 (eight) hours as needed for severe pain. 06/01/19   Khloi Rawl, PA-C  predniSONE (DELTASONE) 20 MG tablet Take 2 tablets (40 mg total) by mouth daily. Patient not taking: Reported on 04/05/2018 01/30/18   Jeannett Senior, PA-C    Family History No family history on file.  Social History Social History   Tobacco Use  . Smoking status: Never Smoker  . Smokeless tobacco: Never Used  Substance Use Topics  . Alcohol use: No  . Drug use: Not on file     Allergies   Patient has no known allergies.   Review of Systems Review of Systems  Constitutional: Negative for appetite change, chills and fever.  HENT: Negative for ear pain, rhinorrhea, sneezing and sore throat.   Eyes: Negative for photophobia and visual disturbance.  Respiratory: Negative for cough, chest tightness, shortness of breath and wheezing.   Cardiovascular: Negative for chest pain and palpitations.  Gastrointestinal: Negative for abdominal pain, blood in stool, constipation, diarrhea, nausea and vomiting.  Genitourinary: Negative for dysuria, hematuria and urgency.  Musculoskeletal: Positive for myalgias.  Skin: Negative for rash.  Neurological: Negative for dizziness, weakness and light-headedness.     Physical Exam Updated Vital Signs BP (!) 159/89 (BP Location: Left Arm)   Pulse 96   Temp 98.3 F (36.8 C) (Oral)   Resp 16   SpO2 96%  Physical Exam Vitals signs and nursing note reviewed.  Constitutional:      General: He is not in acute distress.    Appearance: He is well-developed.  HENT:     Head: Normocephalic and atraumatic.     Nose: Nose normal.  Eyes:     General: No scleral icterus.       Right eye: No discharge.        Left eye: No discharge.     Conjunctiva/sclera: Conjunctivae normal.  Neck:     Musculoskeletal: Normal range of motion and neck supple.   Cardiovascular:     Rate and Rhythm: Normal rate and regular rhythm.     Heart sounds: Normal heart sounds. No murmur. No friction rub. No gallop.   Pulmonary:     Effort: Pulmonary effort is normal. No respiratory distress.     Breath sounds: Normal breath sounds.  Abdominal:     General: Bowel sounds are normal. There is no distension.     Palpations: Abdomen is soft.     Tenderness: There is no abdominal tenderness. There is no guarding.  Musculoskeletal: Normal range of motion.        General: Swelling and tenderness present.     Comments: Golf ball sized area of swelling near R biceps. TTP. 2+ radial pulses noted bilaterally. FROM of R shoulder. Pain with flexion.  Skin:    General: Skin is warm and dry.     Findings: No rash.  Neurological:     Mental Status: He is alert.     Motor: No abnormal muscle tone.     Coordination: Coordination normal.      ED Treatments / Results  Labs (all labs ordered are listed, but only abnormal results are displayed) Labs Reviewed - No data to display  EKG None  Radiology No results found.  Procedures Procedures (including critical care time)  Medications Ordered in ED Medications  oxyCODONE-acetaminophen (PERCOCET/ROXICET) 5-325 MG per tablet 1 tablet (1 tablet Oral Given 06/01/19 2119)     Initial Impression / Assessment and Plan / ED Course  I have reviewed the triage vital signs and the nursing notes.  Pertinent labs & imaging results that were available during my care of the patient were reviewed by me and considered in my medical decision making (see chart for details).        63 year old male presents to ED for right upper arm pain that began 2 days ago.  He was lifting a 60 pound bag at [place] when he felt a ripping sensation in his right upper arm.  Reports progressively worsening pain despite icing it.  On exam there is a golf ball sized area of swelling near the right biceps mid-upper R arm.  Area is  neurovascularly intact. FROM of joints distal and proximal.  Pulses intact.  Denies any systemic symptoms.  Suspect that his symptoms are due to biceps tendon injury.  Spoke to Dr. Doreatha Martin of orthopedics who recommends sling and pain control and follow-up in the office in the morning.  Patient agreeable to plan.  Pain controlled here in the ED.  We will have him return for worsening symptoms.  Patient is hemodynamically stable, in NAD, and able to ambulate in the ED. Evaluation does not show pathology that would require ongoing emergent intervention or inpatient treatment. I explained the diagnosis to the patient. Pain has been managed and has no complaints prior to discharge. Patient is comfortable with above plan and is stable for discharge  at this time. All questions were answered prior to disposition. Strict return precautions for returning to the ED were discussed. Encouraged follow up with PCP.   An After Visit Summary was printed and given to the patient.   Portions of this note were generated with [employer]. Dictation errors may occur despite best attempts at proofreading.   Final Clinical Impressions(s) / ED Diagnoses   Final diagnoses:  Biceps tendinitis of right upper extremity    ED Discharge Orders         Ordered    oxyCODONE-acetaminophen (PERCOCET/ROXICET) 5-325 MG tablet  Every 8 hours PRN     06/01/19 2152           Delia Heady, PA-C 06/01/19 2205    Sherwood Gambler, MD 06/05/19 [phone]

## 2019-07-21 ENCOUNTER — Emergency Department (HOSPITAL_COMMUNITY): Payer: Self-pay

## 2019-07-21 ENCOUNTER — Other Ambulatory Visit: Payer: Self-pay

## 2019-07-21 ENCOUNTER — Encounter (HOSPITAL_COMMUNITY): Payer: Self-pay

## 2019-07-21 ENCOUNTER — Emergency Department (HOSPITAL_COMMUNITY)
Admission: EM | Admit: 2019-07-21 | Discharge: 2019-07-21 | Disposition: A | Discharge status: Home / Self Care | Payer: Self-pay | Attending: Emergency Medicine | Admitting: Emergency Medicine

## 2019-07-21 DIAGNOSIS — R160 Hepatomegaly, not elsewhere classified: Secondary | ICD-10-CM | POA: Insufficient documentation

## 2019-07-21 DIAGNOSIS — R161 Splenomegaly, not elsewhere classified: Secondary | ICD-10-CM | POA: Insufficient documentation

## 2019-07-21 DIAGNOSIS — R911 Solitary pulmonary nodule: Secondary | ICD-10-CM | POA: Insufficient documentation

## 2019-07-21 DIAGNOSIS — N400 Enlarged prostate without lower urinary tract symptoms: Secondary | ICD-10-CM | POA: Insufficient documentation

## 2019-07-21 LAB — COMPREHENSIVE METABOLIC PANEL
ALT: 34 U/L (ref 0–44)
AST: 19 U/L (ref 15–41)
Albumin: 3.1 g/dL — ABNORMAL LOW (ref 3.5–5.0)
Alkaline Phosphatase: 69 U/L (ref 38–126)
Anion gap: 8 (ref 5–15)
BUN: 10 mg/dL (ref 8–23)
CO2: 23 mmol/L (ref 22–32)
Calcium: 8.4 mg/dL — ABNORMAL LOW (ref 8.9–10.3)
Chloride: 107 mmol/L (ref 98–111)
Creatinine, Ser: 0.98 mg/dL (ref 0.61–1.24)
GFR calc Af Amer: >60 mL/min (ref >60)
GFR calc non Af Amer: >60 mL/min (ref >60)
Glucose, Bld: 108 mg/dL — ABNORMAL HIGH (ref 70–99)
Potassium: 3.9 mmol/L (ref 3.5–5.1)
Sodium: 138 mmol/L (ref 135–145)
Total Bilirubin: 0.4 mg/dL (ref 0.3–1.2)
Total Protein: 5.4 g/dL — ABNORMAL LOW (ref 6.5–8.1)

## 2019-07-21 LAB — URINALYSIS, ROUTINE W REFLEX MICROSCOPIC
Bilirubin Urine: NEGATIVE
Glucose, UA: NEGATIVE mg/dL
Ketones, ur: NEGATIVE mg/dL
Leukocytes,Ua: NEGATIVE
Nitrite: NEGATIVE
Protein, ur: 100 mg/dL — AB
RBC / HPF: >50 RBC/hpf — ABNORMAL HIGH (ref 0–5)
Specific Gravity, Urine: 1.01 (ref 1.005–1.030)
pH: 6 (ref 5.0–8.0)

## 2019-07-21 LAB — CBC WITH DIFFERENTIAL/PLATELET
Abs Immature Granulocytes: 0.02 10*3/uL (ref 0.00–0.07)
Basophils Absolute: 0.1 10*3/uL (ref 0.0–0.1)
Basophils Relative: 1 %
Eosinophils Absolute: 0.2 10*3/uL (ref 0.0–0.5)
Eosinophils Relative: 2 %
HCT: 48.5 % (ref 39.0–52.0)
Hemoglobin: 16 g/dL (ref 13.0–17.0)
Immature Granulocytes: 0 %
Lymphocytes Relative: 21 %
Lymphs Abs: 1.8 10*3/uL (ref 0.7–4.0)
MCH: 29.8 pg (ref 26.0–34.0)
MCHC: 33 g/dL (ref 30.0–36.0)
MCV: 90.3 fL (ref 80.0–100.0)
Monocytes Absolute: 0.5 10*3/uL (ref 0.1–1.0)
Monocytes Relative: 6 %
Neutro Abs: 5.7 10*3/uL (ref 1.7–7.7)
Neutrophils Relative %: 70 %
Platelets: 151 10*3/uL (ref 150–400)
RBC: 5.37 MIL/uL (ref 4.22–5.81)
RDW: 14 % (ref 11.5–15.5)
WBC: 8.2 10*3/uL (ref 4.0–10.5)
nRBC: 0 % (ref 0.0–0.2)

## 2019-07-21 LAB — PSA: Prostatic Specific Antigen: 1.37 ng/mL (ref 0.00–4.00)

## 2019-07-21 MED ORDER — ACETAMINOPHEN 500 MG PO TABS
1000.0000 mg | ORAL_TABLET | Freq: Once | ORAL | Status: AC
  Administered 2019-07-21: 1000 mg via ORAL
  Filled 2019-07-21: qty 2

## 2019-07-21 MED ORDER — IOHEXOL 300 MG/ML  SOLN
100.0000 mL | Freq: Once | INTRAMUSCULAR | Status: AC | PRN
  Administered 2019-07-21: 100 mL via INTRAVENOUS

## 2019-07-21 MED ORDER — MORPHINE SULFATE (PF) 4 MG/ML IV SOLN
6.0000 mg | Freq: Once | INTRAVENOUS | Status: AC
  Administered 2019-07-21: 6 mg via INTRAVENOUS
  Filled 2019-07-21: qty 2

## 2019-07-21 NOTE — ED Notes (Signed)
Patient Alert and oriented to baseline. Stable and ambulatory to baseline. Patient verbalized understanding of the discharge instructions.  Patient belongings were taken by the patient.   

## 2019-07-21 NOTE — ED Triage Notes (Signed)
Pt states he has blood clots in his urine, back pain, feeling weak since last night, denies fever.

## 2019-07-21 NOTE — ED Provider Notes (Signed)
[place] EMERGENCY DEPARTMENT Provider Note   CSN: [id number] Arrival date & time: 07/21/19  1332     History   Chief Complaint No chief complaint on file.   HPI Douglas Lewis is a 63 y.o. male with unknown past medical history presents to the ER for evaluation of hematuria.  This occurred last night and has happened several times.  Also reports a large clot that came out.  Reports associated right low back and flank pain for 2 to 3 months that is dull, intermittent.  There is no aggravating factors to the back pain.  States for the last several months he has had decreased energy, fatigue, decreased appetite.  He denies any fevers, night sweats, unintentional weight loss.  He denies any nausea, vomiting, abdominal pain, dysuria, urinary frequency.  States for the last couple of years he has noticed some urinary urgency where it has become harder to hold the urine and.  He denies any changes in his urine stream, dribbling, urinary retention or hesitancy.  He was sexually active 2 months ago with a male without condom use but denies any penile discharge, testicular pain.  No rectal discomfort or drainage.  No falls or back trauma.  No anticoagulants.  States 6 years ago he went to the doctor for back pain and they did images of his back and they told him that there was a "mass" somewhere in his pelvis.  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     HPI  History reviewed. No pertinent past medical history.  There are no active problems to display for this patient.   History reviewed. No pertinent surgical history.      Home Medications    Prior to Admission medications   Medication Sig Start Date End Date Taking? Authorizing Provider  albuterol (PROVENTIL HFA;VENTOLIN HFA) 108 (90 Base) MCG/ACT inhaler Inhale 1-2 puffs into the lungs every 6 (six) hours as needed for wheezing. 04/05/18   Larene Pickett, PA-C    azithromycin (ZITHROMAX) 250 MG tablet Take 1 tablet (250 mg total) by mouth daily. Take first 2 tablets together, then 1 every day until finished. Patient not taking: Reported on 04/05/2018 01/30/18   Jeannett Senior, PA-C  benzonatate (TESSALON) 100 MG capsule Take 1 capsule (100 mg total) by mouth every 8 (eight) hours. 04/05/18   Larene Pickett, PA-C  cyclobenzaprine (FLEXERIL) 10 MG tablet Take 1 tablet (10 mg total) by mouth 3 (three) times daily as needed for muscle spasms. Patient not taking: Reported on 04/05/2018 06/29/17   Ripley Fraise, MD  doxycycline (VIBRAMYCIN) 100 MG capsule Take 1 capsule (100 mg total) by mouth 2 (two) times daily. 04/05/18   Larene Pickett, PA-C  oxyCODONE-acetaminophen (PERCOCET/ROXICET) 5-325 MG tablet Take 1 tablet by mouth every 8 (eight) hours as needed for severe pain. 06/01/19   Khatri, Hina, PA-C  predniSONE (DELTASONE) 20 MG tablet Take 2 tablets (40 mg total) by mouth daily. Patient not taking: Reported on 04/05/2018 01/30/18   Jeannett Senior, PA-C    Family History History reviewed. No pertinent family history.  Social History Social History   Tobacco Use   Smoking status: Never Smoker   Smokeless tobacco: Never Used  Substance Use Topics   Alcohol use: No   Drug use: Not on file     Allergies   Patient has no known allergies.   Review of Systems Review of Systems  Constitutional: Positive for appetite change and fatigue.  Genitourinary: Positive for flank pain and hematuria.  All other systems reviewed and are negative.    Physical Exam Updated Vital Signs BP (!) 156/88    Pulse (!) 55    Temp 98.5 F (36.9 C) (Oral)    Resp 14    SpO2 99%   Physical Exam Vitals signs and nursing note reviewed.  Constitutional:      Appearance: He is well-developed.     Comments: Non toxic.  HENT:     Head: Normocephalic and atraumatic.     Nose: Nose normal.  Eyes:     Conjunctiva/sclera: Conjunctivae normal.  Neck:      Musculoskeletal: Normal range of motion.  Cardiovascular:     Rate and Rhythm: Normal rate and regular rhythm.     Heart sounds: Normal heart sounds.  Pulmonary:     Effort: Pulmonary effort is normal.     Breath sounds: Normal breath sounds.  Abdominal:     General: Bowel sounds are normal.     Palpations: Abdomen is soft.     Tenderness: There is no abdominal tenderness. There is right CVA tenderness.     Comments: Mild right CVAT. No G/R/R. No suprapubic tenderness. Negative Murphy's and McBurney's. No pulsatility.   Musculoskeletal: Normal range of motion.  Skin:    General: Skin is warm and dry.     Capillary Refill: Capillary refill takes less than 2 seconds.  Neurological:     Mental Status: He is alert.  Psychiatric:        Behavior: Behavior normal.      ED Treatments / Results  Labs (all labs ordered are listed, but only abnormal results are displayed) Labs Reviewed  COMPREHENSIVE METABOLIC PANEL - Abnormal; Notable for the following components:      Result Value   Glucose, Bld 108 (*)    Calcium 8.4 (*)    Total Protein 5.4 (*)    Albumin 3.1 (*)    All other components within normal limits  URINALYSIS, ROUTINE W REFLEX MICROSCOPIC - Abnormal; Notable for the following components:   Color, Urine AMBER (*)    Hgb urine dipstick LARGE (*)    Protein, ur 100 (*)    RBC / HPF >50 (*)    Bacteria, UA FEW (*)    All other components within normal limits  URINE CULTURE  CBC WITH DIFFERENTIAL/PLATELET  PSA  GC/CHLAMYDIA PROBE AMP ([place]) NOT AT [facility]    EKG None  Radiology Ct Abdomen Pelvis W Contrast  Result Date: 07/21/2019 CLINICAL DATA:  Hematuria. Right flank pain. Enlarged prostate versus bladder mass on exam. EXAM: CT ABDOMEN AND PELVIS WITH CONTRAST TECHNIQUE: Multidetector CT imaging of the abdomen and pelvis was performed using the standard protocol following bolus administration of intravenous contrast. CONTRAST:  114mL OMNIPAQUE IOHEXOL 300  MG/ML  SOLN COMPARISON:  Noncontrast CT on 07/21/2019 FINDINGS: Lower Chest: No acute findings. Hepatobiliary: A 2.5 cm heterogeneously enhancing lesion is seen in the anterior right hepatic lobe on image 19/3. This has nonspecific characteristics. No other liver lesions are identified. Gallbladder is unremarkable. No evidence of biliary ductal dilatation. Pancreas:  No mass or inflammatory changes. Spleen: Normal splenic size. A low-attenuation mass is seen in the superior spleen which measures 5.2 x 4.6 cm. This has nonspecific characteristics. Adrenals/Urinary Tract: No adrenal or renal masses identified. No evidence of ureteral calculi or hydronephrosis. Diffuse bladder wall thickening is seen, likely due to chronic bladder  outlet obstruction given significantly enlarged prostate. Stomach/Bowel: No evidence of obstruction, inflammatory process or abnormal fluid collections. Normal appendix visualized. Diffuse colonic diverticulosis is seen, however there is no evidence of diverticulitis. Vascular/Lymphatic: No pathologically enlarged lymph nodes. No abdominal aortic aneurysm. Reproductive: Moderately enlarged prostate gland is seen with marked median lobe hypertrophy protruding into the bladder base. Symmetric seminal vesicles. Other:  None. Musculoskeletal:  No suspicious bone lesions identified. IMPRESSION: 1. Moderately enlarged prostate, with marked median lobe hypertrophy protruding into the bladder base. 2. Diffuse bladder wall thickening, likely due to chronic bladder outlet obstruction. 3. Colonic diverticulosis. No radiographic evidence of diverticulitis. 4. Hepatic and splenic masses which have nonspecific characteristics. Nonemergent outpatient abdomen MRI without and with contrast is recommended for further characterization. Electronically Signed   By: Marlaine Hind M.D.   On: 07/21/2019 18:08   Ct Renal Stone Study  Result Date: 07/21/2019 CLINICAL DATA:  Hematuria and right flank pain. EXAM: CT  ABDOMEN AND PELVIS WITHOUT CONTRAST TECHNIQUE: Multidetector CT imaging of the abdomen and pelvis was performed following the standard protocol without IV contrast. COMPARISON:  None. FINDINGS: Lower chest: We partially include a 0.8 by 0.5 cm right middle lobe pulmonary nodule on image 1/5, possibly sub solid, but only partially included. Minimal nodularity along the minor fissure. Subpleural nodules along the right middle lobe and lingula in the 2-3 mm range. Hepatobiliary: Unremarkable Pancreas: Unremarkable Spleen: Unremarkable Adrenals/Urinary Tract: Soft tissue density posteriorly along the urinary bladder may be from markedly enlarged prostate gland or bladder mass. No urinary tract calculi are identified. No hydronephrosis or hydroureter. Adrenal glands normal. Stomach/Bowel: Descending and sigmoid colon diverticulosis. Normal appendix. Vascular/Lymphatic: Aortoiliac atherosclerotic vascular disease. Reproductive: Prominent prostate gland to the extent that I am uncertain whether the posterior bladder soft tissue density is from markedly enlarged prostate gland or bladder tumor. Assuming that this is all from prostate gland, then the prostate measures 6.3 by 5.5 by 8.3 cm (volume = 150 cm^3). Other: No supplemental non-categorized findings. Musculoskeletal: Mild lower lumbar spondylosis and degenerative disc disease. 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 IMPRESSION: 1. Prominent soft tissue density along the posteroinferior urinary bladder likely due to a massively enlarged prostate gland. Difficult to completely exclude the possibility of adjacent bladder tumor on today's noncontrast assessment. 2. No hydronephrosis, hydroureter, or urinary tract calculi identified. 3. The appendix and gallbladder appear unremarkable, a specific cause for the patient's right flank pain is not identified. 4. Descending and sigmoid colon diverticulosis. 5.  Aortic Atherosclerosis  (ICD10-I70.0). 6. We partially include an 8 by 5 mm right middle lobe pulmonary nodule which may be a ground-glass density nodule, a this nodules only visible on the top most image. Several additional tiny 2-3 mm subpleural nodules along the lung bases. Consider dedicated CT of the chest for complete characterization. 7. 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 Electronically Signed   By: Van Clines M.D.   On: 07/21/2019 15:15    Procedures Procedures (including critical care time)  Medications Ordered in ED Medications  morphine 4 MG/ML injection 6 mg (6 mg Intravenous Given 07/21/19 1515)  acetaminophen (TYLENOL) tablet 1,000 mg (1,000 mg Oral Given 07/21/19 1644)  iohexol (OMNIPAQUE) 300 MG/ML solution 100 mL (100 mLs Intravenous Contrast Given 07/21/19 1732)     Initial Impression / Assessment and Plan / ED Course  I have reviewed the triage vital signs and the nursing notes.  Pertinent labs & imaging results  that were available during my care of the patient were reviewed by me and considered in my medical decision making (see chart for details).  Clinical Course as of Jul 20 1936  Mon Jul 21, 2019  1524 1. Prominent soft tissue density along the posteroinferior urinary bladder likely due to a massively enlarged prostate gland. Difficult to completely exclude the possibility of adjacent bladder tumor on today's noncontrast assessment. 2. No hydronephrosis, hydroureter, or urinary tract calculi identified. 3. The appendix and gallbladder appear unremarkable, a specific cause for the patient's right flank pain is not identified. 4. Descending and sigmoid colon diverticulosis. 5. Aortic Atherosclerosis (ICD10-I70.0). 6. We partially include an 8 by 5 mm right middle lobe pulmonary nodule which may be a ground-glass density nodule, a this nodules only visible on the top most image. Several additional tiny 2-3 mm subpleural nodules along the lung bases.  Consider dedicated CT of the chest for complete characterization. 7. 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   CT Renal Stone Study [CG]  1544 Hgb urine dipstick(!): LARGE [CG]  1544 RBC / HPF(!): >50 [CG]  1544 WBC, UA: 0-5 [CG]  1544 Bacteria, UA(!): FEW [CG]  1544 Nitrite: NEGATIVE [CG]  1544 Leukocytes,Ua: NEGATIVE [CG]  1555 Creatinine: 0.98 [CG]    Clinical Course User Index [CG] Kinnie Feil, PA-C   DDX includes renal stone versus recently passed stone versus pyelonephritis.  He has no fall, back trauma to suggest renal traumatic injury.  He is not on any blood thinners.  ER work-up reviewed by me shows greater than 50 RBCs, few bacteria but no other signs of infection in his urine.  He has no fevers, chills, dysuria, frequency.  He does have right CVA tenderness but states that been there for 3 months.  I considered pyelonephritis/UTI less likely, will defer antibiotics here and send urine for culture.  He was concerned about recent unprotected sexual encounter but has no STD symptoms, gonorrhea and chlamydia has been added to the urine and pending.  Initial CT renal shows possible prostate hypertrophy vs bladder mass.  I spoke to radiologist who is unable to really determine if this is isolated hypertrophy of the prostate or if there also happens to be mass in the bladder that is obscuring view.  I discussed patient with urology Dr. Jeffie Pollock who recommended CT A/P with contrast with renal/bladder delays for better visualization.  CT A/P reviewed by me and radiologist.  I spoke to Dr. Jeffie Pollock again who also reviewed images.  Based on CT findings there is concern for likely prostate mass unfortunately malignancy is in the differential.  Patient also has a mass in the spleen, liver and several pulmonary nodules.  His PSA is normal.  Patient has no difficulty voiding.  He has no signs of obstruction.  Rectal exam deferred as he has no voiding difficulty,  rectal pain.  Spoke to Dr. Jeffie Pollock who recommends close follow-up in the office for further evaluation of CT findings, possible malignancy.  Discussed findings with patient and communicated concern for possible malignancy and importance of follow-up.  Patient states he will call tomorrow to make an appointment.  Final Clinical Impressions(s) / ED Diagnoses   Final diagnoses:  Prostate hypertrophy  Splenic mass  Liver mass  Pulmonary nodule    ED Discharge Orders    None       Arlean Hopping 07/21/19 1937    Noemi Chapel, MD 07/22/19 [phone]

## 2019-07-21 NOTE — Discharge Instructions (Addendum)
You were seen in the ER for blood in your urine, back pain.  We obtained CT scans which showed a very enlarged prostate, thickening of your bladder, masses in your liver, spleen and some nodules in your lungs.  I spoke to urologist Dr. Jeffie Pollock who has reviewed the images.  You need further work-up with urologist to further determine the cause of your enlarged prostate.  Cancer needs to be ruled out.

## 2019-07-22 LAB — URINE CULTURE: Culture: NO GROWTH

## 2019-07-22 LAB — GC/CHLAMYDIA PROBE AMP (~~LOC~~) NOT AT ARMC
Chlamydia: NEGATIVE
Neisseria Gonorrhea: NEGATIVE

## 2019-07-23 ENCOUNTER — Other Ambulatory Visit: Payer: Self-pay | Admitting: Urology

## 2019-07-23 ENCOUNTER — Other Ambulatory Visit (HOSPITAL_COMMUNITY): Payer: Self-pay | Admitting: Urology

## 2019-07-23 DIAGNOSIS — R935 Abnormal findings on diagnostic imaging of other abdominal regions, including retroperitoneum: Secondary | ICD-10-CM

## 2019-07-24 ENCOUNTER — Ambulatory Visit (HOSPITAL_COMMUNITY): Admission: RE | Admit: 2019-07-24 | Payer: Self-pay | Source: Ambulatory Visit

## 2019-08-11 ENCOUNTER — Ambulatory Visit (HOSPITAL_COMMUNITY): Payer: Medicare Other

## 2019-08-19 ENCOUNTER — Ambulatory Visit (HOSPITAL_COMMUNITY): Payer: Medicare Other

## 2019-08-25 ENCOUNTER — Encounter (HOSPITAL_COMMUNITY): Payer: Self-pay

## 2019-08-25 ENCOUNTER — Ambulatory Visit (HOSPITAL_COMMUNITY): Admission: RE | Admit: 2019-08-25 | Payer: Medicare Other | Source: Ambulatory Visit

## 2019-09-17 ENCOUNTER — Other Ambulatory Visit: Payer: Self-pay

## 2019-09-17 ENCOUNTER — Ambulatory Visit (HOSPITAL_COMMUNITY)
Admission: RE | Admit: 2019-09-17 | Discharge: 2019-09-17 | Disposition: A | Discharge status: Home / Self Care | Payer: Medicare Other | Source: Ambulatory Visit | Attending: Urology | Admitting: Urology

## 2019-09-17 DIAGNOSIS — R935 Abnormal findings on diagnostic imaging of other abdominal regions, including retroperitoneum: Secondary | ICD-10-CM

## 2019-09-17 LAB — POCT I-STAT CREATININE: Creatinine, Ser: 1 mg/dL (ref 0.61–1.24)

## 2019-09-17 MED ORDER — GADOBUTROL 1 MMOL/ML IV SOLN
10.0000 mL | Freq: Once | INTRAVENOUS | Status: AC | PRN
  Administered 2019-09-17: 10 mL via INTRAVENOUS

## 2019-10-01 ENCOUNTER — Other Ambulatory Visit: Payer: Self-pay

## 2019-10-01 ENCOUNTER — Ambulatory Visit
Admission: RE | Admit: 2019-10-01 | Discharge: 2019-10-01 | Disposition: A | Discharge status: Home / Self Care | Payer: Medicare Other | Source: Ambulatory Visit | Attending: Sports Medicine | Admitting: Sports Medicine

## 2019-10-01 ENCOUNTER — Ambulatory Visit (INDEPENDENT_AMBULATORY_CARE_PROVIDER_SITE_OTHER): Payer: Medicare Other | Admitting: Sports Medicine

## 2019-10-01 ENCOUNTER — Encounter: Payer: Self-pay | Admitting: Sports Medicine

## 2019-10-01 VITALS — BP 132/82 | Ht 72.0 in | Wt 215.0 lb

## 2019-10-01 DIAGNOSIS — G8929 Other chronic pain: Secondary | ICD-10-CM

## 2019-10-01 DIAGNOSIS — M25561 Pain in right knee: Secondary | ICD-10-CM

## 2019-10-01 DIAGNOSIS — S46211A Strain of muscle, fascia and tendon of other parts of biceps, right arm, initial encounter: Secondary | ICD-10-CM | POA: Diagnosis not present

## 2019-10-01 NOTE — Progress Notes (Addendum)
[facility] [address] [place], [place] [address] Phone: [phone] Fax: [phone]   Patient Name: Douglas Lewis Date of Birth: Sep 02, 1956 Medical Record Number: [id number] Gender: male Date of Encounter: 10/01/2019  CC: Right elbow and right knee pain  HPI: Right elbow Patient presents for right elbow pain that started 4 months ago.  He was lifting a 60 pound bag at [place] and felt a ripping sensation and pop in the right upper arm.  He reported to the ED where they told him he ruptured his biceps tendon, but unfortunately did not have insurance and was unable to follow-up.  More recently, he describes the inability to open jars without pain.  He describes a deformity at the elbow.  He complains of weakness.  Additionally there was a lot of swelling and ecchymosis that has since resolved.  He has not been taking medication regularly.  He has a history of at least one rotator cuff surgery to the right shoulder.  Right knee Patient is also complaining of 10 years of right knee pain.  States 10 years ago he had a twisting fall at home and was seen by a provider who told him he likely tore his meniscus.  Once that time he has been focusing on cycling to keep his knee strong, but states he still has pain, instability, and swelling along the inside of his knee.  He lives in [place] in the winter season, and is very active on [employer].  He recently turned 39 so now has health insurance to address his concerns.  He had his right ACL reconstructed with a hamstring graft over 40 years ago.  He is scheduled to see urology on Monday for a prostate biopsy and plans to live in [place] from January-March 2021.  History reviewed. No pertinent past medical history.  Current Outpatient Medications on File Prior to Visit  Medication Sig Dispense Refill  . albuterol (PROVENTIL HFA;VENTOLIN HFA) 108 (90 Base) MCG/ACT inhaler Inhale 1-2 puffs into the lungs every 6  (six) hours as needed for wheezing. (Patient not taking: Reported on 10/01/2019) 1 Inhaler 0  . azithromycin (ZITHROMAX) 250 MG tablet Take 1 tablet (250 mg total) by mouth daily. Take first 2 tablets together, then 1 every day until finished. (Patient not taking: Reported on 04/05/2018) 6 tablet 0  . benzonatate (TESSALON) 100 MG capsule Take 1 capsule (100 mg total) by mouth every 8 (eight) hours. (Patient not taking: Reported on 10/01/2019) 21 capsule 0  . cyclobenzaprine (FLEXERIL) 10 MG tablet Take 1 tablet (10 mg total) by mouth 3 (three) times daily as needed for muscle spasms. (Patient not taking: Reported on 04/05/2018) 10 tablet 0  . doxycycline (VIBRAMYCIN) 100 MG capsule Take 1 capsule (100 mg total) by mouth 2 (two) times daily. (Patient not taking: Reported on 10/01/2019) 20 capsule 0  . oxyCODONE-acetaminophen (PERCOCET/ROXICET) 5-325 MG tablet Take 1 tablet by mouth every 8 (eight) hours as needed for severe pain. (Patient not taking: Reported on 10/01/2019) 10 tablet 0  . predniSONE (DELTASONE) 20 MG tablet Take 2 tablets (40 mg total) by mouth daily. (Patient not taking: Reported on 04/05/2018) 10 tablet 0   No current facility-administered medications on file prior to visit.     History reviewed. No pertinent surgical history.  No Known Allergies  Social History   Socioeconomic History  . Marital status: Single    Spouse name: Not on file  . Number of children: Not on file  .  Years of education: Not on file  . Highest education level: Not on file  Occupational History  . Not on file  Social Needs  . Financial resource strain: Not on file  . Food insecurity    Worry: Not on file    Inability: Not on file  . Transportation needs    Medical: Not on file    Non-medical: Not on file  Tobacco Use  . Smoking status: Never Smoker  . Smokeless tobacco: Never Used  Substance and Sexual Activity  . Alcohol use: No  . Drug use: Not on file  . Sexual activity: Not on file   Lifestyle  . Physical activity    Days per week: Not on file    Minutes per session: Not on file  . Stress: Not on file  Relationships  . Social Herbalist on phone: Not on file    Gets together: Not on file    Attends religious service: Not on file    Active member of club or organization: Not on file    Attends meetings of clubs or organizations: Not on file    Relationship status: Not on file  . Intimate partner violence    Fear of current or ex partner: Not on file    Emotionally abused: Not on file    Physically abused: Not on file    Forced sexual activity: Not on file  Other Topics Concern  . Not on file  Social History Narrative  . Not on file    History reviewed. No pertinent family history.  BP 132/82   Ht 6' (1.829 m)   Wt 215 lb (97.5 kg)   BMI 29.16 kg/m   ROS:  See HPI CONST: no F/C, no malaise, no fatigue MSK: See above NEURO: no numbness/tingling, no weakness SKIN: no rash, no lesions HEME: no bleeding, no bruising, no erythema  Objective: GEN: Alert and oriented, NAD Pulm: Breathing unlabored PSY: normal mood, congruent affect  Right elbow: Demarcation at distal biceps with Popeye deformity Range of motion full pronation, supination, flexion, extension. Decreased strength in flexion and supination Stable to varus, valgus stress. Negative moving valgus stress test. Mild TTP at distal biceps Negative hook sign Negative cubital tunnel Tinel's. NVI  Left Elbow: Unremarkable to inspection. Range of motion full pronation, supination, flexion, extension. Strength is full to all of the above directions Stable to varus, valgus stress. Negative moving valgus stress test. No discrete areas of tenderness to palpation. Ulnar nerve does not sublux. Negative cubital tunnel Tinel's.  Right knee: No erythema Scar on distal biceps femoris and medial knee Medial knee bulging TTP at medial joint line Unable to obtain full TKE Pain with  hyperflexion Ligaments with solid consistent endpoints including ACL, PCL, LCL, MCL. Positive Thessaly tests. Non painful patellar compression. Patellar glide without crepitus. Patellar and quadriceps tendons unremarkable. Hamstring and quadriceps strength is normal.  Neurovascularly intact.  Left knee: Normal to inspection with no erythema or effusion or obvious bony abnormalities. Palpation normal with no warmth, joint line tenderness, patellar tenderness, or condyle tenderness. ROM full in flexion and extension and lower leg rotation. Ligaments with solid consistent endpoints including ACL, PCL, LCL, MCL. Negative Mcmurray's and Thessaly tests. Non painful patellar compression. Patellar glide without crepitus. Patellar and quadriceps tendons unremarkable. Hamstring and quadriceps strength is normal.  Neurovascularly intact.  Limited MSK ultrasound Right arm Distal biceps tendon appears intact with mild hypoechoic changes. Proximal bicipital groove shows calcification and lack of  tendon sitting in groove with hypoechoic changes at proximal muscle belly.  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  Right biceps tendon rupture  Given that rupture was at least 4 months ago and patient able to perform most of his normal duties, we will refer for physical therapy to regain strength and function of arm.  He can follow-up with me in 4-6 weeks.  2.  Right knee pain  Given instability and PE findings, I am concerned for degenerative meniscus tear.  I will send him for XR today to evaluate level of osteoarthritis.  I will call him with these results next week and talk about either an injection or a surgical consultation.  He can use Voltaren gel and p.o. NSAID in the interim.   Lanier Clam, DO, ATC Sports Medicine Fellow  Addendum:  Patient seen in the office by fellow.  His history, exam, plan of care were precepted with  me.  Karlton Lemon MD Kirt Boys

## 2019-10-01 NOTE — Patient Instructions (Signed)
You likely tore your proximal biceps tendon based on our findings today We have ordered physical therapy for you to start for your elbow and knee Please stop by [facility] imaging on your way home to get XR of your knee Someone from my office will call you next week with those results and discuss follow-up You can use Voltaren gel for your knee, it is over-the-counter

## 2019-10-07 ENCOUNTER — Telehealth: Payer: Self-pay | Admitting: Sports Medicine

## 2019-10-07 ENCOUNTER — Other Ambulatory Visit: Payer: Self-pay

## 2019-10-07 ENCOUNTER — Other Ambulatory Visit: Payer: Self-pay | Admitting: Urology

## 2019-10-07 DIAGNOSIS — G8929 Other chronic pain: Secondary | ICD-10-CM

## 2019-10-07 NOTE — Telephone Encounter (Signed)
I spoke with Douglas Lewis this morning about the results of his XLR findings.  Discussed the severity of the osteoarthritis in his right knee.  I offered the patient formal physical therapy and corticosteroid injections, but he would like to move forward with surgical intervention.  I will place a referral for him to be seen at [facility].  As a reminder, he is going to [place] in January for a few months, so I am not hopeful that he can have the surgery before he leaves.  If that is the case we can consider an injection in the interim.

## 2019-10-09 ENCOUNTER — Other Ambulatory Visit: Payer: Self-pay

## 2019-10-09 ENCOUNTER — Encounter (HOSPITAL_BASED_OUTPATIENT_CLINIC_OR_DEPARTMENT_OTHER): Payer: Self-pay | Admitting: *Deleted

## 2019-10-09 NOTE — Progress Notes (Signed)
Spoke with patient via telephone for pre op interview. NPO after MN. No medications AM of surgery. Arrival time 1045.

## 2019-10-13 ENCOUNTER — Other Ambulatory Visit (HOSPITAL_COMMUNITY)
Admission: RE | Admit: 2019-10-13 | Discharge: 2019-10-13 | Disposition: A | Discharge status: Home / Self Care | Payer: Medicare Other | Source: Ambulatory Visit | Attending: Urology | Admitting: Urology

## 2019-10-13 DIAGNOSIS — Z01812 Encounter for preprocedural laboratory examination: Secondary | ICD-10-CM | POA: Insufficient documentation

## 2019-10-13 DIAGNOSIS — Z20828 Contact with and (suspected) exposure to other viral communicable diseases: Secondary | ICD-10-CM | POA: Insufficient documentation

## 2019-10-14 LAB — NOVEL CORONAVIRUS, NAA (HOSP ORDER, SEND-OUT TO REF LAB; TAT 18-24 HRS): SARS-CoV-2, NAA: NOT DETECTED

## 2019-10-16 ENCOUNTER — Encounter (HOSPITAL_BASED_OUTPATIENT_CLINIC_OR_DEPARTMENT_OTHER)
Admission: RE | Disposition: A | Discharge status: Home / Self Care | Payer: Self-pay | Source: Home / Self Care | Attending: Urology

## 2019-10-16 ENCOUNTER — Encounter (HOSPITAL_BASED_OUTPATIENT_CLINIC_OR_DEPARTMENT_OTHER): Payer: Self-pay | Admitting: Urology

## 2019-10-16 ENCOUNTER — Other Ambulatory Visit: Payer: Self-pay

## 2019-10-16 ENCOUNTER — Ambulatory Visit (HOSPITAL_BASED_OUTPATIENT_CLINIC_OR_DEPARTMENT_OTHER): Payer: Medicare Other | Admitting: Anesthesiology

## 2019-10-16 ENCOUNTER — Observation Stay (HOSPITAL_BASED_OUTPATIENT_CLINIC_OR_DEPARTMENT_OTHER)
Admission: RE | Admit: 2019-10-16 | Discharge: 2019-10-17 | Disposition: A | Discharge status: Home / Self Care | Payer: Medicare Other | Attending: Urology | Admitting: Urology

## 2019-10-16 DIAGNOSIS — G8929 Other chronic pain: Secondary | ICD-10-CM | POA: Insufficient documentation

## 2019-10-16 DIAGNOSIS — F419 Anxiety disorder, unspecified: Secondary | ICD-10-CM | POA: Diagnosis not present

## 2019-10-16 DIAGNOSIS — N138 Other obstructive and reflux uropathy: Secondary | ICD-10-CM | POA: Diagnosis not present

## 2019-10-16 DIAGNOSIS — R3915 Urgency of urination: Secondary | ICD-10-CM | POA: Insufficient documentation

## 2019-10-16 DIAGNOSIS — D1803 Hemangioma of intra-abdominal structures: Secondary | ICD-10-CM | POA: Diagnosis not present

## 2019-10-16 DIAGNOSIS — N401 Enlarged prostate with lower urinary tract symptoms: Principal | ICD-10-CM | POA: Insufficient documentation

## 2019-10-16 DIAGNOSIS — Z79899 Other long term (current) drug therapy: Secondary | ICD-10-CM | POA: Insufficient documentation

## 2019-10-16 DIAGNOSIS — N35912 Unspecified bulbous urethral stricture, male: Secondary | ICD-10-CM | POA: Diagnosis not present

## 2019-10-16 DIAGNOSIS — N35812 Other urethral bulbous stricture, male: Secondary | ICD-10-CM

## 2019-10-16 HISTORY — PX: CYSTOSCOPY WITH URETHRAL DILATATION: SHX5125

## 2019-10-16 HISTORY — DX: Other specified health status: Z78.9

## 2019-10-16 LAB — HEMOGLOBIN AND HEMATOCRIT, BLOOD
HCT: 50 % (ref 39.0–52.0)
Hemoglobin: 16.2 g/dL (ref 13.0–17.0)

## 2019-10-16 SURGERY — CYSTOSCOPY, WITH URETHRAL DILATION
Anesthesia: General | Site: Pelvis

## 2019-10-16 MED ORDER — SODIUM CHLORIDE 0.9 % IR SOLN
Status: DC | PRN
  Administered 2019-10-16: 6000 mL

## 2019-10-16 MED ORDER — FENTANYL CITRATE (PF) 100 MCG/2ML IJ SOLN
INTRAMUSCULAR | Status: DC | PRN
  Administered 2019-10-16 (×7): 50 ug via INTRAVENOUS

## 2019-10-16 MED ORDER — ONDANSETRON HCL 4 MG/2ML IJ SOLN
INTRAMUSCULAR | Status: DC | PRN
  Administered 2019-10-16: 4 mg via INTRAVENOUS

## 2019-10-16 MED ORDER — SODIUM CHLORIDE 0.9 % IR SOLN
Status: DC | PRN
  Administered 2019-10-16: 3000 mL

## 2019-10-16 MED ORDER — LIDOCAINE 2% (20 MG/ML) 5 ML SYRINGE
INTRAMUSCULAR | Status: AC
  Filled 2019-10-16: qty 5

## 2019-10-16 MED ORDER — SODIUM CHLORIDE 0.9 % IR SOLN
Status: DC | PRN
  Administered 2019-10-16: 9000 mL via INTRAVESICAL

## 2019-10-16 MED ORDER — MIDAZOLAM HCL 2 MG/2ML IJ SOLN
INTRAMUSCULAR | Status: AC
  Filled 2019-10-16: qty 2

## 2019-10-16 MED ORDER — DEXAMETHASONE SODIUM PHOSPHATE 10 MG/ML IJ SOLN
INTRAMUSCULAR | Status: AC
  Filled 2019-10-16: qty 1

## 2019-10-16 MED ORDER — OXYCODONE HCL 5 MG PO TABS
5.0000 mg | ORAL_TABLET | Freq: Once | ORAL | Status: DC | PRN
  Filled 2019-10-16: qty 1

## 2019-10-16 MED ORDER — ACETAMINOPHEN 325 MG PO TABS
ORAL_TABLET | ORAL | Status: AC
  Filled 2019-10-16: qty 2

## 2019-10-16 MED ORDER — OXYCODONE HCL 5 MG/5ML PO SOLN
5.0000 mg | Freq: Once | ORAL | Status: DC | PRN
  Filled 2019-10-16: qty 5

## 2019-10-16 MED ORDER — OXYCODONE HCL 5 MG PO TABS
5.0000 mg | ORAL_TABLET | ORAL | Status: DC | PRN
  Administered 2019-10-16 – 2019-10-17 (×4): 5 mg via ORAL
  Filled 2019-10-16: qty 1

## 2019-10-16 MED ORDER — FENTANYL CITRATE (PF) 100 MCG/2ML IJ SOLN
25.0000 ug | INTRAMUSCULAR | Status: DC | PRN
  Administered 2019-10-16 (×2): 50 ug via INTRAVENOUS
  Filled 2019-10-16: qty 1

## 2019-10-16 MED ORDER — OXYCODONE HCL 5 MG PO TABS
ORAL_TABLET | ORAL | Status: AC
  Filled 2019-10-16: qty 1

## 2019-10-16 MED ORDER — MENTHOL 3 MG MT LOZG
LOZENGE | OROMUCOSAL | Status: AC
  Filled 2019-10-16: qty 9

## 2019-10-16 MED ORDER — PROPOFOL 10 MG/ML IV BOLUS
INTRAVENOUS | Status: DC | PRN
  Administered 2019-10-16: 200 mg via INTRAVENOUS
  Administered 2019-10-16: 50 mg via INTRAVENOUS

## 2019-10-16 MED ORDER — POTASSIUM CHLORIDE IN NACL 20-0.45 MEQ/L-% IV SOLN
INTRAVENOUS | Status: DC
  Administered 2019-10-16 – 2019-10-17 (×2): via INTRAVENOUS
  Filled 2019-10-16 (×4): qty 1000

## 2019-10-16 MED ORDER — SODIUM CHLORIDE 0.9 % IR SOLN
Status: DC | PRN
  Administered 2019-10-16: 15000 mL

## 2019-10-16 MED ORDER — MIDAZOLAM HCL 5 MG/5ML IJ SOLN
INTRAMUSCULAR | Status: DC | PRN
  Administered 2019-10-16: 2 mg via INTRAVENOUS

## 2019-10-16 MED ORDER — FLEET ENEMA 7-19 GM/118ML RE ENEM
1.0000 | ENEMA | Freq: Once | RECTAL | Status: DC | PRN
  Filled 2019-10-16: qty 1

## 2019-10-16 MED ORDER — PROPOFOL 10 MG/ML IV BOLUS
INTRAVENOUS | Status: AC
  Filled 2019-10-16: qty 20

## 2019-10-16 MED ORDER — ACETAMINOPHEN 325 MG PO TABS
650.0000 mg | ORAL_TABLET | ORAL | Status: DC | PRN
  Administered 2019-10-16 – 2019-10-17 (×2): 650 mg via ORAL
  Filled 2019-10-16: qty 2

## 2019-10-16 MED ORDER — LIDOCAINE 2% (20 MG/ML) 5 ML SYRINGE
INTRAMUSCULAR | Status: DC | PRN
  Administered 2019-10-16: 100 mg via INTRAVENOUS

## 2019-10-16 MED ORDER — PROMETHAZINE HCL 25 MG/ML IJ SOLN
6.2500 mg | INTRAMUSCULAR | Status: DC | PRN
  Filled 2019-10-16: qty 1

## 2019-10-16 MED ORDER — FENTANYL CITRATE (PF) 100 MCG/2ML IJ SOLN
INTRAMUSCULAR | Status: AC
  Filled 2019-10-16: qty 2

## 2019-10-16 MED ORDER — CEFAZOLIN SODIUM-DEXTROSE 2-4 GM/100ML-% IV SOLN
2.0000 g | INTRAVENOUS | Status: AC
  Administered 2019-10-16: 2 g via INTRAVENOUS
  Filled 2019-10-16: qty 100

## 2019-10-16 MED ORDER — CEFAZOLIN SODIUM-DEXTROSE 2-4 GM/100ML-% IV SOLN
INTRAVENOUS | Status: AC
  Filled 2019-10-16: qty 100

## 2019-10-16 MED ORDER — ONDANSETRON HCL 4 MG/2ML IJ SOLN
4.0000 mg | INTRAMUSCULAR | Status: DC | PRN
  Filled 2019-10-16: qty 2

## 2019-10-16 MED ORDER — LACTATED RINGERS IV SOLN
INTRAVENOUS | Status: DC
  Administered 2019-10-16 (×2): via INTRAVENOUS
  Filled 2019-10-16 (×2): qty 1000

## 2019-10-16 MED ORDER — LACTATED RINGERS IV SOLN
INTRAVENOUS | Status: DC
  Administered 2019-10-16: 11:00:00 via INTRAVENOUS
  Filled 2019-10-16: qty 1000

## 2019-10-16 MED ORDER — FENTANYL CITRATE (PF) 250 MCG/5ML IJ SOLN
INTRAMUSCULAR | Status: AC
  Filled 2019-10-16: qty 5

## 2019-10-16 MED ORDER — MENTHOL 3 MG MT LOZG
1.0000 | LOZENGE | OROMUCOSAL | Status: DC | PRN
  Administered 2019-10-16: 3 mg via ORAL
  Filled 2019-10-16: qty 9

## 2019-10-16 MED ORDER — SODIUM CHLORIDE 0.9 % IR SOLN
3000.0000 mL | Status: DC
  Administered 2019-10-17: 3000 mL
  Filled 2019-10-16: qty 3000

## 2019-10-16 MED ORDER — SENNOSIDES-DOCUSATE SODIUM 8.6-50 MG PO TABS
1.0000 | ORAL_TABLET | Freq: Every evening | ORAL | Status: DC | PRN
  Filled 2019-10-16: qty 1

## 2019-10-16 MED ORDER — BISACODYL 10 MG RE SUPP
10.0000 mg | Freq: Every day | RECTAL | Status: DC | PRN
  Filled 2019-10-16: qty 1

## 2019-10-16 MED ORDER — HYDROMORPHONE HCL 1 MG/ML IJ SOLN
0.5000 mg | INTRAMUSCULAR | Status: DC | PRN
  Filled 2019-10-16: qty 1

## 2019-10-16 MED ORDER — STERILE WATER FOR IRRIGATION IR SOLN
Status: DC | PRN
  Administered 2019-10-16: 3000 mL

## 2019-10-16 MED ORDER — DEXAMETHASONE SODIUM PHOSPHATE 4 MG/ML IJ SOLN
INTRAMUSCULAR | Status: DC | PRN
  Administered 2019-10-16: 10 mg via INTRAVENOUS

## 2019-10-16 MED ORDER — ONDANSETRON HCL 4 MG/2ML IJ SOLN
INTRAMUSCULAR | Status: AC
  Filled 2019-10-16: qty 2

## 2019-10-16 SURGICAL SUPPLY — 35 items
BAG DRAIN URO-CYSTO SKYTR STRL (DRAIN) ×2 IMPLANT
BAG URINE DRAIN 2000ML AR STRL (UROLOGICAL SUPPLIES) ×2 IMPLANT
BALLN NEPHROSTOMY (BALLOONS) ×2
BALLOON NEPHROSTOMY (BALLOONS) ×1 IMPLANT
CATH FOLEY 2WAY SLVR  5CC 16FR (CATHETERS)
CATH FOLEY 2WAY SLVR 5CC 16FR (CATHETERS) IMPLANT
CATH FOLEY 3WAY 30CC 22FR (CATHETERS) ×2 IMPLANT
CLOTH BEACON ORANGE TIMEOUT ST (SAFETY) ×2 IMPLANT
ELECT BIVAP BIPO 22/24 DONUT (ELECTROSURGICAL) ×2
ELECT REM PT RETURN 9FT ADLT (ELECTROSURGICAL) ×2
ELECTRD BIVAP BIPO 22/24 DONUT (ELECTROSURGICAL) ×1 IMPLANT
ELECTRODE REM PT RTRN 9FT ADLT (ELECTROSURGICAL) ×1 IMPLANT
GLOVE BIO SURGEON STRL SZ7 (GLOVE) ×2 IMPLANT
GLOVE BIOGEL PI IND STRL 7.0 (GLOVE) ×1 IMPLANT
GLOVE BIOGEL PI INDICATOR 7.0 (GLOVE) ×1
GLOVE SURG SS PI 8.0 STRL IVOR (GLOVE) ×2 IMPLANT
GOWN STRL REUS W/TWL LRG LVL3 (GOWN DISPOSABLE) ×2 IMPLANT
GOWN STRL REUS W/TWL XL LVL3 (GOWN DISPOSABLE) ×2 IMPLANT
GUIDEWIRE STR DUAL SENSOR (WIRE) ×2 IMPLANT
HOLDER FOLEY CATH W/STRAP (MISCELLANEOUS) ×2 IMPLANT
IV NS IRRIG 3000ML ARTHROMATIC (IV SOLUTION) ×22 IMPLANT
KIT TURNOVER CYSTO (KITS) ×2 IMPLANT
LOOP CUT BIPOLAR 24F LRG (ELECTROSURGICAL) ×2 IMPLANT
MANIFOLD NEPTUNE II (INSTRUMENTS) ×2 IMPLANT
NDL SAFETY ECLIPSE 18X1.5 (NEEDLE) IMPLANT
NEEDLE HYPO 18GX1.5 SHARP (NEEDLE)
NEEDLE HYPO 22GX1.5 SAFETY (NEEDLE) IMPLANT
NS IRRIG 500ML POUR BTL (IV SOLUTION) ×2 IMPLANT
PACK CYSTO (CUSTOM PROCEDURE TRAY) ×2 IMPLANT
SYR 20ML LL LF (SYRINGE) ×2 IMPLANT
SYR 30ML LL (SYRINGE) ×2 IMPLANT
SYR TOOMEY IRRIG 70ML (MISCELLANEOUS) ×2
SYRINGE TOOMEY IRRIG 70ML (MISCELLANEOUS) ×1 IMPLANT
TUBE CONNECTING 12X1/4 (SUCTIONS) ×2 IMPLANT
WATER STERILE IRR 3000ML UROMA (IV SOLUTION) ×2 IMPLANT

## 2019-10-16 NOTE — H&P (Signed)
CC/HPI: I have blood in my urine.     Douglas Lewis returns today in f/u for cystoscopy to complete a hematuria evaluation. he had hepatic and splenic lesions on his CT as well as a pulmonary nodule and subsequently had an MRI of the abdomen. The MRI showed a benign or indolent splenic lesion and a repeat MRI in 6 months was recommended. 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 A Chest CT was also done and that study demonstrated a 28mm right anterior upper lobe nodule and smaller nodules that had been seen on the CT AP. F/U in 65mo was recommended. No cause for the hematuria was identified other than an enlarged prostate.    GU Hx: Douglas Lewis is a 63 yo was seen in the ER over the weekend for gross hematuria. He had the onset of gross hematuria on Sunday. He passed clots. He has had progressive LUTS over the last year with urgency with UUI and will have to have a BM at the same time. He has a reduced stream. He reports the hematuria has resolved. He had a PSA of 1.37 but his prostate was very enlarged with irregularity on CT and he had spleen, liver and pulmonary lesions on the CT. He has stable weight. He has chronic back pain and some left shoulder blade pain. He reports he has had a splenic lesion for several years. He reports it was 3.5cm at last check and now it is 4.5cm.    ALLERGIES: None   MEDICATIONS: Diazepam 10 mg tablet 1-2 tablet PO Daily Take one hour prior to procedure.     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 I don't see a need for an [place] and biopsy at this time. He doesn't want to start treatment at this time despite his LUTS. - 07/23/2019 Gross hematuria, The hematuria has resolved but he will need cystoscopy to complete the evaluation so I will have him return following the  imaging for a flowrate and cystoscopy. I have sent valium since he is quite anxious. - 07/23/2019 Urinary Urgency - 07/23/2019    NON-GU PMH: Abnormal findings on diagnostic imaging of liver and biliary tract - 07/23/2019 Abnormal findings on diagnostic imaging of other abdominal regions, including retroperitoneum, He has lung, liver and splenic lesions and a CT Chest and MRI Abd were recommmended so I have ordered those, but he reports the splenic lesion is long standing but it has enlarged since his last imaging. - 07/23/2019 Solitary pulmonary nodule - 07/23/2019 Anxiety Arthritis    FAMILY HISTORY: Breast Cancer - Sister ovarian cancer - Mother   SOCIAL HISTORY: Marital Status: Married Preferred Language: English; Race: White Current Smoking Status: Patient has never smoked.   Tobacco Use Assessment Completed: Used Tobacco in last 30 days? Drinks 2 caffeinated drinks per day. 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  Gastrointestinal (Upper):   Patient denies nausea, vomiting, and indigestion/ heartburn.  Gastrointestinal (Lower):   Patient denies diarrhea and constipation.  Constitutional:   Patient denies fever, night sweats, weight loss, and fatigue.  Skin:   Patient denies skin rash/ lesion and itching.  Eyes:   Patient denies blurred vision and double vision.  Ears/ Nose/ Throat:   Patient denies sore throat and sinus problems.  Hematologic/Lymphatic:   Patient denies swollen glands and easy bruising.  Cardiovascular:   Patient denies leg swelling and chest pains.  Respiratory:   Patient denies cough and shortness of breath.  Endocrine:   Patient denies  excessive thirst.  Musculoskeletal:   Patient denies back pain and joint pain.  Neurological:   Patient denies headaches and dizziness.  Psychologic:   Patient denies depression and anxiety.   VITAL SIGNS:      10/06/2019 10:23 AM  BP 132/82 mmHg  Pulse 86 /min  Temperature 98.2 F / 36.7 C   MULTI-SYSTEM PHYSICAL EXAMINATION:    Constitutional: Well-nourished. No physical deformities. Normally developed. Good grooming.  Respiratory: Normal breath sounds. No labored breathing, no use of accessory muscles.   Cardiovascular: Regular rate and rhythm. No murmur, no gallop.      PAST DATA REVIEWED:  Source Of History:  Patient  Urine Test Review:   Urinalysis  Urodynamics Review:   Review Flow Rate  X-Ray Review: C.T. Chest: Reviewed Report. Discussed With Patient.  MRI Abdomen: Reviewed Report. Discussed With Patient.     PROCEDURES:         Flexible Cystoscopy - 52000  Risks, benefits, and some of the potential complications of the procedure were discussed. 54ml of 2% lidocaine jelly was instilled intraurethrally.  Cipro 500mg  given for antibiotic prophylaxis.     Meatus:  Normal size. Normal location. Normal condition.  Urethra:  Moderate membranous stricture that precludes passage of the scope.  External Sphincter:  Normal.  Verumontanum:  Unable to visualize  Prostate:  unable to visualize  Bladder Neck:  unable to visualize  Ureteral Orifices:  unable to visualize  Bladder:  unable to visualize      The procedure was well tolerated and there were no complications.        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 I have reviewed the risks of bleeding, infection, recurrent stricture, thrombotic events and anesthetic complications. .   2   Gross hematuria - R31.0 He has had no further hematuria.   3 NON-GU:   Abnormal findings on diagnostic imaging of liver and biliary tract - R93.2 He has an hemangioma of the liver.   4   Abnormal findings on diagnostic imaging of other abdominal regions, including retroperitoneum - R93.5 The splenic lesion is benign or indolent and 6 month f/u imaging was recommended.   5   Solitary pulmonary nodule -  R91.1 He has a few small pulmonary nodules and 6 month f/u imaging was recommended.    PLAN:           Schedule Return Visit/Planned Activity: Next Available Appointment - Schedule Surgery

## 2019-10-16 NOTE — Op Note (Signed)
Procedure: 1.  Cystoscopy with balloon dilation of bulbar urethral stricture. 2.  Transurethral resection of prostate.  Preop diagnosis: Bulbar urethral stricture.  Postop diagnosis: Same and BPH with bladder outlet obstruction with massive middle lobe with difficult to control bleeding.  Surgeon: Dr. Irine Seal.  Anesthesia: General.  Specimen: Prostate chips.  Drains: 22 French three-way Foley catheter.  EBL: 500 mL.  Complications: Prostate bleeding.  Indications: The patient is a 63 year old male with obstructive voiding symptoms who was found on cystoscopy to have a severe bulbar urethral stricture he is to have cystoscopy with balloon dilation.  Procedure: He was given preoperative antibiotic.  He was taken operating room where general anesthetic was induced.  He was placed in lithotomy position and fitted with PAS hose.  His perineum and genitalia were prepped with Betadine solution he was draped in usual sterile fashion.  Cystoscopy was performed using 23 [place] scope and 30 degree lens.  Examination revealed a normal urethra until the proximal bulb where there was a very tight stricture.  A sensor guidewire was advanced through the stricture into the bladder with position confirmed with fluoroscopy.  A 24 French balloon dilation catheter.  Was passed over the wire under fluoroscopic guidance.  The balloon was inflated to 20 atm of water pressure with inflation held for 1 minute.  The balloon was then deflated and removed.  The cystoscope was then reinserted alongside the wire and the urethral stricture was found to be well disrupted.  The prostatic urethra had relatively short lateral lobes but a massive middle lobe with some bleeding on the lateral aspects of the middle lobe that appeared to be related to the wire passage.  It was difficult to inspect the bladder because of the degree of hemorrhage.  I then used a Bugbee electrode to try to fulgurate the bleeders because of the  degree of bleeding I felt catheter drainage alone would not be adequate.  The Bugbee electrode merely aggravated the bleeding.  I then changed to a continuous-flow resectoscope sheath fitted with an Beatrix Fetters handle with initially a button and the 30 degree lens.  The irrigant was changed to saline.  An attempt was made to fulgurate the areas of bleeding with the button electrode but once again all this did was aggravate the bleeding.  At this point because the patient's symptoms were obstructive I felt the most appropriate thing to do was to resect the middle lobe to try to manage the bleeding but also relieve his outlet obstruction since that was his primary symptom.  The button was switched to a bipolar loop and the middle lobe was resected down to the capsular fibers at the bladder neck and out to the floor the prostate to the proximal Veru.  Some of the posterior lateral lobes were taken proximally but a more apical sparing procedure was performed to try to preserve ejaculation.  The tissue was somewhat firm and more flesh like and less glandular which raises some concern for possible neoplasm.  During the resection there was a fair amount of bleeding but I was able to visualize and cauterize the bleeders as needed.  Once the very large middle lobe had been resected the bladder was evacuated free of chips and further hemostasis was obtained.  I was at this point able to inspect the bladder and he was noted to have moderate severe trabeculation.  There were no mucosal lesions noted.  I was able to identify the ureteral orifices and they were well away  from the bladder neck.  Final inspection revealed no retained chips, no active bleeding and a widely patent channel.  The external sphincter was intact.  The scope was removed and pressure on the bladder produced an excellent stream.  A 22 French three-way Foley catheter was placed with the aid of a catheter guide.  The guide was removed and the balloon was  filled with 30 mL of sterile fluid.  The catheter was irrigated by hand with clear return in place to continuous irrigation and straight drainage.  He will be admitted for overnight observation with a voiding trial potentially in the morning.  The procedure was complicated by the hemorrhage and I will follow his hemoglobin postoperatively.

## 2019-10-16 NOTE — Discharge Instructions (Signed)
Transurethral Resection of the Prostate, Care After °This sheet gives you information about how to care for yourself after your procedure. Your health care provider may also give you more specific instructions. If you have problems or questions, contact your health care provider. °What can I expect after the procedure? °After the procedure, it is common to have: °· Mild pain in your lower abdomen. °· Soreness or mild discomfort in your penis from having the catheter inserted during the procedure. °· A feeling of urgency when you need to urinate. °· A small amount of blood in your urine. You may notice some small blood clots in your urine. These are normal. °Follow these instructions at home: °Medicines °· Take over-the-counter and prescription medicines only as told by your health care provider. °· If you were prescribed an antibiotic medicine, take it as told by your health care provider. Do not stop taking the antibiotic even if you start to feel better. °· Ask your health care provider if the medicine prescribed to you: °? Requires you to avoid driving or using heavy machinery. °? Can cause constipation. You may need to take actions to prevent or treat constipation, such as: °§ Take over-the-counter or prescription medicines. °§ Eat foods that are high in fiber, such as fresh fruits and vegetables, whole grains, and beans. °§ Limit foods that are high in fat and processed sugars, such as fried or sweet foods. °· Do not drive for 24 hours if you were given a sedative during your procedure. °Activity ° °· Return to your normal activities as told by your health care provider. Ask your health care provider what activities are safe for you. °· Do not lift anything that is heavier than 10 lb (4.5 kg), or the limit that you are told, for 3 weeks after the procedure or until your health care provider says that it is safe. °· Avoid intense physical activity for as long as told by your health care provider. °· Avoid  sitting for a long time without moving. Get up and move around one or more times every few hours. This helps to prevent blood clots. You may increase your physical activity gradually as you start to feel better. °Lifestyle °· Do not drink alcohol for as long as told by your health care provider. This is especially important if you are taking prescription pain medicines. °· Do not engage in sexual activity until your health care provider says that you can do this. °General instructions ° °· Do not take baths, swim, or use a hot tub until your health care provider approves. °· Drink enough fluid to keep your urine pale yellow. °· Urinate as soon as you feel the need to. Do not try to hold your urine for long periods of time. °· If your health care provider approves, you may take a stool softener for 2-3 weeks to prevent you from straining to have a bowel movement. °· Wear compression stockings as told by your health care provider. These stockings help to prevent blood clots and reduce swelling in your legs. °· Keep all follow-up visits as told by your health care provider. This is important. °Contact a health care provider if you have: °· Difficulty urinating. °· A fever. °· Pain that gets worse or does not improve with medicine. °· Blood in your urine that does not go away after 1 week of resting and drinking more fluids. °· Swelling in your penis or testicles. °Get help right away if: °· You are unable   to urinate. °· You are having more blood clots in your urine instead of fewer. °· You have: °? Large blood clots. °? A lot of blood in your urine. °? Pain in your back or lower abdomen. °? Pain or swelling in your legs. °? Chills and you are shaking. °? Difficulty breathing or shortness of breath. °Summary °· After the procedure, it is common to have a small amount of blood in your urine. °· Avoid heavy lifting and intense physical activity for as long as told by your health care provider. °· Urinate as soon as you  feel the need to. Do not try to hold your urine for long periods of time. °· Keep all follow-up visits as told by your health care provider. This is important. °This information is not intended to replace advice given to you by your health care provider. Make sure you discuss any questions you have with your health care provider. °Document Released: 10/23/2005 Document Revised: 02/12/2019 Document Reviewed: 07/24/2018 °Elsevier Patient Education © 2020 [employer] ° °

## 2019-10-16 NOTE — Transfer of Care (Signed)
Immediate Anesthesia Transfer of Care Note  Patient: Douglas Lewis  Procedure(s) Performed: Procedure(s) (LRB): CYSTOSCOPY WITH URETHRAL BALLOON DILATATION; FULGERATION AND TRANSURETHRAL RESECTION OF PROSTATE (N/A)  Patient Location: PACU  Anesthesia Type: General  Level of Consciousness: awake, sedated, patient cooperative and responds to stimulation  Airway & Oxygen Therapy: Patient Spontanous Breathing and Patient connected to [place] 02 and soft FM   Post-op Assessment: Report given to PACU RN, Post -op Vital signs reviewed and stable and Patient moving all extremities  Post vital signs: Reviewed and stable  Complications: No apparent anesthesia complications

## 2019-10-16 NOTE — Anesthesia Procedure Notes (Signed)
Procedure Name: LMA Insertion Date/Time: 10/16/2019 12:44 PM Performed by: Justice Rocher, CRNA Pre-anesthesia Checklist: Patient identified, Emergency Drugs available, Suction available and Patient being monitored Patient Re-evaluated:Patient Re-evaluated prior to induction Oxygen Delivery Method: Circle system utilized Preoxygenation: Pre-oxygenation with 100% oxygen Induction Type: IV induction Ventilation: Mask ventilation without difficulty LMA: LMA inserted LMA Size: 5.0 Number of attempts: 1 Airway Equipment and Method: Bite block Placement Confirmation: positive ETCO2 and breath sounds checked- equal and bilateral Tube secured with: Tape Dental Injury: Teeth and Oropharynx as per pre-operative assessment

## 2019-10-16 NOTE — Anesthesia Preprocedure Evaluation (Addendum)
Anesthesia Evaluation  Patient identified by MRN, date of birth, ID band Patient awake    Reviewed: Allergy & Precautions, NPO status , Patient's Chart, lab work & pertinent test results  History of Anesthesia Complications Negative for: history of anesthetic complications  Airway Mallampati: II  TM Distance: >3 FB Neck ROM: Full    Dental  (+) Dental Advisory Given, Missing   Pulmonary neg pulmonary ROS,    Pulmonary exam normal        Cardiovascular negative cardio ROS Normal cardiovascular exam     Neuro/Psych negative neurological ROS  negative psych ROS   GI/Hepatic negative GI ROS, Neg liver ROS,   Endo/Other  negative endocrine ROS  Renal/GU negative Renal ROS    Ureteral stricture     Musculoskeletal negative musculoskeletal ROS (+)   Abdominal   Peds  Hematology negative hematology ROS (+)   Anesthesia Other Findings   Reproductive/Obstetrics                            Anesthesia Physical Anesthesia Plan  ASA: I  Anesthesia Plan: General   Post-op Pain Management:    Induction: Intravenous  PONV Risk Score and Plan: 3 and Treatment may vary due to age or medical condition, Ondansetron and Dexamethasone  Airway Management Planned: LMA  Additional Equipment: None  Intra-op Plan:   Post-operative Plan: Extubation in OR  Informed Consent: I have reviewed the patients History and Physical, chart, labs and discussed the procedure including the risks, benefits and alternatives for the proposed anesthesia with the patient or authorized representative who has indicated his/her understanding and acceptance.     Dental advisory given  Plan Discussed with: CRNA and Anesthesiologist  Anesthesia Plan Comments:        Anesthesia Quick Evaluation

## 2019-10-16 NOTE — Interval H&P Note (Signed)
He has had increased difficulty voiding.

## 2019-10-16 NOTE — Anesthesia Postprocedure Evaluation (Signed)
Anesthesia Post Note  Patient: Douglas Lewis  Procedure(s) Performed: CYSTOSCOPY WITH URETHRAL BALLOON DILATATION; FULGERATION AND TRANSURETHRAL RESECTION OF PROSTATE (N/A Pelvis)     Patient location during evaluation: PACU Anesthesia Type: General Level of consciousness: awake and alert Pain management: pain level controlled Vital Signs Assessment: post-procedure vital signs reviewed and stable Respiratory status: spontaneous breathing, nonlabored ventilation and respiratory function stable Cardiovascular status: blood pressure returned to baseline and stable Postop Assessment: no apparent nausea or vomiting Anesthetic complications: no    Last Vitals:  Vitals:   10/16/19 1515 10/16/19 1530  BP: 132/76 128/78  Pulse: 80 80  Resp: 15 15  Temp:    SpO2: 97% 98%    Last Pain:  Vitals:   10/16/19 1425  TempSrc:   PainSc: 0-No pain                 Audry Pili

## 2019-10-17 DIAGNOSIS — N401 Enlarged prostate with lower urinary tract symptoms: Secondary | ICD-10-CM | POA: Diagnosis not present

## 2019-10-17 LAB — HEMOGLOBIN AND HEMATOCRIT, BLOOD
HCT: 45.1 % (ref 39.0–52.0)
Hemoglobin: 14.7 g/dL (ref 13.0–17.0)

## 2019-10-17 MED ORDER — ACETAMINOPHEN 325 MG PO TABS
ORAL_TABLET | ORAL | Status: AC
  Filled 2019-10-17: qty 2

## 2019-10-17 MED ORDER — OXYCODONE HCL 5 MG PO TABS
ORAL_TABLET | ORAL | Status: AC
  Filled 2019-10-17: qty 1

## 2019-10-17 MED ORDER — GUAIFENESIN 100 MG/5ML PO SOLN
5.0000 mL | ORAL | Status: DC | PRN
  Administered 2019-10-17: 100 mg via ORAL
  Filled 2019-10-17 (×2): qty 5

## 2019-10-17 NOTE — Progress Notes (Signed)
10/17/2019 1200 Pt. With coughing spell this am. States he feels like he has been coming down with a chest cold for a few days now and that using the IS is causing him to cough up a lot of phlegm. Dr. Jeffie Pollock Paged and made aware. Verbal orders received ok to administer Robitussin PRN. Orders placed and enacted. Will continue to closely monitor patient.  Douglas Lewis, Douglas Lewis

## 2019-10-17 NOTE — Progress Notes (Signed)
Dr. Jeffie Pollock paged for d/c meds.   Returned call and updated that pt has no d/c medication to go home with.    Dr. Jeffie Pollock is not sending him home on any meds.

## 2019-10-19 LAB — SURGICAL PATHOLOGY

## 2019-10-20 DIAGNOSIS — N35812 Other urethral bulbous stricture, male: Secondary | ICD-10-CM

## 2019-10-20 NOTE — Discharge Summary (Signed)
Physician Discharge Summary  Patient ID: Douglas Lewis MRN: [id number] DOB/AGE: 04-05-1956 63 y.o.  Admit date: 10/16/2019 Discharge date: 10/20/2019  Admission Diagnoses:  BPH with urinary obstruction  Discharge Diagnoses:  Principal Problem:   BPH with urinary obstruction Active Problems:   Other urethral bulbous stricture, male   Past Medical History:  Diagnosis Date  . Medical history non-contributory   . Ureteral stricture 2020    Surgeries: Procedure(s): CYSTOSCOPY WITH URETHRAL BALLOON DILATATION; FULGERATION AND TRANSURETHRAL RESECTION OF PROSTATE on 10/16/2019   Consultants (if any):   Discharged Condition: Improved  Hospital Course: Agnew Zinck is an 63 y.o. male who was admitted 10/16/2019 with a diagnosis of BPH with urinary obstruction and went to the operating room on 10/16/2019 and underwent the above named procedures.  The foley was removed on POD #1 and he was able to void without difficulty and was discharged home.   He was given perioperative antibiotics:  Anti-infectives (From admission, onward)   Start     Dose/Rate Route Frequency Ordered Stop   10/16/19 1123  ceFAZolin (ANCEF) IVPB 2g/100 mL premix     2 g 200 mL/hr over 30 Minutes Intravenous 30 min pre-op 10/16/19 1123 10/16/19 1316    .  He was given sequential compression devices for DVT prophylaxis.  He benefited maximally from the hospital stay and there were no complications.    Recent vital signs:  Vitals:   10/17/19 0626 10/17/19 1152  BP: (!) 155/85 (!) 148/84  Pulse: 67 84  Resp: 18 16  Temp: (!) 97.5 F (36.4 C) 98.4 F (36.9 C)  SpO2: 96% 98%    Recent laboratory studies:  Lab Results  Component Value Date   HGB 14.7 10/17/2019   HGB 16.2 10/16/2019   HGB 16.0 07/21/2019   Lab Results  Component Value Date   WBC 8.2 07/21/2019   PLT 151 07/21/2019   No results found for: INR Lab Results  Component Value Date   NA 138 07/21/2019   K 3.9 07/21/2019   CL 107  07/21/2019   CO2 23 07/21/2019   BUN 10 07/21/2019   CREATININE 1.00 09/17/2019   GLUCOSE 108 (H) 07/21/2019    Discharge Medications:   Allergies as of 10/17/2019   No Known Allergies     Medication List    You have not been prescribed any medications.     Diagnostic Studies: DG Knee 1-2 Views Left  Result Date: 10/01/2019 CLINICAL DATA:  Chronic bilateral knee pain. EXAM: LEFT KNEE - 1-2 VIEW COMPARISON:  Contralateral knee. FINDINGS: Mild patellofemoral osteoarthritic changes also with mild joint space narrowing but without significant osteophytes in the medial and lateral compartments. No signs of acute fracture or effusion. 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 Electronically Signed   By: Zetta Bills M.D.   On: 10/01/2019 13:15   DG Knee AP/LAT W/[place] Right  Result Date: 10/01/2019 CLINICAL DATA:  Right knee pain. EXAM: RIGHT KNEE 3 VIEWS COMPARISON:  Contralateral knee. FINDINGS: Severe tricompartmental osteoarthritic changes. Loss of the joint space with eburnation. Marginal osteophytes are noted. No signs of knee effusion. No signs of fracture PICC IMPRESSION: Severe tricompartmental osteoarthritic changes.  No acute fracture. Electronically Signed   By: Zetta Bills M.D.   On: 10/01/2019 13:13    Disposition: Discharge disposition: 01-Home or Self Care       Discharge Instructions    Discontinue IV   Complete by: As directed       Follow-up Information  Karen Kays, NP On 10/23/2019.   Specialty: Nurse Practitioner Why: 1pm Contact information: [address] [place] [address] [phone]            Signed: Irine Seal 10/20/2019, 11:56 AM

## 2019-10-30 ENCOUNTER — Other Ambulatory Visit: Payer: Self-pay

## 2019-10-30 ENCOUNTER — Emergency Department (HOSPITAL_COMMUNITY)
Admission: EM | Admit: 2019-10-30 | Discharge: 2019-10-30 | Disposition: A | Discharge status: Home / Self Care | Payer: Medicare Other | Attending: Emergency Medicine | Admitting: Emergency Medicine

## 2019-10-30 DIAGNOSIS — R319 Hematuria, unspecified: Secondary | ICD-10-CM | POA: Diagnosis present

## 2019-10-30 DIAGNOSIS — R300 Dysuria: Secondary | ICD-10-CM | POA: Insufficient documentation

## 2019-10-30 DIAGNOSIS — N3289 Other specified disorders of bladder: Secondary | ICD-10-CM | POA: Diagnosis not present

## 2019-10-30 DIAGNOSIS — Z9889 Other specified postprocedural states: Secondary | ICD-10-CM | POA: Insufficient documentation

## 2019-10-30 DIAGNOSIS — R197 Diarrhea, unspecified: Secondary | ICD-10-CM | POA: Diagnosis not present

## 2019-10-30 LAB — COMPREHENSIVE METABOLIC PANEL
ALT: 46 U/L — ABNORMAL HIGH (ref 0–44)
AST: 28 U/L (ref 15–41)
Albumin: 3.5 g/dL (ref 3.5–5.0)
Alkaline Phosphatase: 82 U/L (ref 38–126)
Anion gap: 10 (ref 5–15)
BUN: 16 mg/dL (ref 8–23)
CO2: 22 mmol/L (ref 22–32)
Calcium: 8.6 mg/dL — ABNORMAL LOW (ref 8.9–10.3)
Chloride: 107 mmol/L (ref 98–111)
Creatinine, Ser: 1.17 mg/dL (ref 0.61–1.24)
GFR calc Af Amer: >60 mL/min (ref >60)
GFR calc non Af Amer: >60 mL/min (ref >60)
Glucose, Bld: 120 mg/dL — ABNORMAL HIGH (ref 70–99)
Potassium: 4.4 mmol/L (ref 3.5–5.1)
Sodium: 139 mmol/L (ref 135–145)
Total Bilirubin: 0.5 mg/dL (ref 0.3–1.2)
Total Protein: 6.1 g/dL — ABNORMAL LOW (ref 6.5–8.1)

## 2019-10-30 LAB — CBC
HCT: 51.1 % (ref 39.0–52.0)
Hemoglobin: 16.8 g/dL (ref 13.0–17.0)
MCH: 29.7 pg (ref 26.0–34.0)
MCHC: 32.9 g/dL (ref 30.0–36.0)
MCV: 90.4 fL (ref 80.0–100.0)
Platelets: 200 10*3/uL (ref 150–400)
RBC: 5.65 MIL/uL (ref 4.22–5.81)
RDW: 14.4 % (ref 11.5–15.5)
WBC: 12.7 10*3/uL — ABNORMAL HIGH (ref 4.0–10.5)
nRBC: 0 % (ref 0.0–0.2)

## 2019-10-30 LAB — URINALYSIS, ROUTINE W REFLEX MICROSCOPIC

## 2019-10-30 LAB — LIPASE, BLOOD: Lipase: 52 U/L — ABNORMAL HIGH (ref 11–51)

## 2019-10-30 LAB — URINALYSIS, MICROSCOPIC (REFLEX): RBC / HPF: >50 RBC/hpf (ref 0–5)

## 2019-10-30 MED ORDER — SODIUM CHLORIDE 0.9% FLUSH
3.0000 mL | Freq: Once | INTRAVENOUS | Status: AC
  Administered 2019-10-30: 3 mL via INTRAVENOUS

## 2019-10-30 MED ORDER — PHENAZOPYRIDINE HCL 200 MG PO TABS: 200.0000 mg | ORAL_TABLET | Freq: Three times a day (TID) | ORAL | 0 refills | Status: DC

## 2019-10-30 MED ORDER — MORPHINE SULFATE (PF) 4 MG/ML IV SOLN
4.0000 mg | Freq: Once | INTRAVENOUS | Status: AC
  Administered 2019-10-30: 4 mg via INTRAVENOUS
  Filled 2019-10-30: qty 1

## 2019-10-30 MED ORDER — OXYBUTYNIN CHLORIDE 5 MG PO TABS: 5.0000 mg | ORAL_TABLET | Freq: Two times a day (BID) | ORAL | 0 refills | Status: DC | PRN

## 2019-10-30 MED ORDER — HYDROCODONE-ACETAMINOPHEN 5-325 MG PO TABS: 1.0000 | ORAL_TABLET | Freq: Four times a day (QID) | ORAL | 0 refills | Status: DC | PRN

## 2019-10-30 MED ORDER — PHENAZOPYRIDINE HCL 95 MG PO TABS: 95.0000 mg | ORAL_TABLET | Freq: Three times a day (TID) | ORAL | 0 refills | Status: DC | PRN

## 2019-10-30 MED ORDER — OXYBUTYNIN CHLORIDE 5 MG PO TABS
5.0000 mg | ORAL_TABLET | Freq: Once | ORAL | Status: AC
  Administered 2019-10-30: 5 mg via ORAL
  Filled 2019-10-30: qty 1

## 2019-10-30 MED ORDER — PHENAZOPYRIDINE HCL 100 MG PO TABS
95.0000 mg | ORAL_TABLET | Freq: Once | ORAL | Status: AC
  Administered 2019-10-30: 100 mg via ORAL
  Filled 2019-10-30: qty 1

## 2019-10-30 NOTE — ED Notes (Signed)
Patient verbalizes understanding of discharge instructions. Opportunity for questioning and answers were provided. Armband removed by staff, pt discharged from ED ambulatory.   

## 2019-10-30 NOTE — Discharge Instructions (Signed)
Take the medications as prescribed. Follow-up with your urologist as soon as possible for further management of your symptoms. Return to the ED if you start to have worsening pain, inability to urinate, injuries or falls, lightheadedness.

## 2019-10-30 NOTE — ED Triage Notes (Addendum)
Pt here for evaluation of pain with urination and trouble emptying his bladder since prostate surgery on 10/16/19.  Pt states he has continued to have blood in his urine.  Denies any N/V/D and fevers.

## 2019-10-30 NOTE — ED Provider Notes (Signed)
[facility] EMERGENCY DEPARTMENT Provider Note   CSN: [id number] Arrival date & time: 10/30/19  1316     History No chief complaint on file.   Douglas Lewis is a 63 y.o. male with a past medical history of BPH status post TURP on 10/16/2019 subsequently discharged presents to the ED for ongoing hematuria and dysuria.  States that his hematuria has gotten worse and the bleeding darker.  He was told that his hematuria would improve after 1 week.  States that the Percocet will help with the pain but pain will return when the medication wears off.  He did see his urologist for a follow-up appointment 1 week ago.  He believes that he was prescribed antibiotics for a UTI and has been taking this with no improvement in his symptoms. Also reports nonbloody diarrhea.  Denies any fevers, discharge, abdominal pain,  Inability to urinate.  HPI     Past Medical History:  Diagnosis Date   Medical history non-contributory    Ureteral stricture 2020    Patient Active Problem List   Diagnosis Date Noted   Other urethral bulbous stricture, male 10/20/2019   BPH with urinary obstruction 10/16/2019    Past Surgical History:  Procedure Laterality Date   CYSTOSCOPY WITH URETHRAL DILATATION N/A 10/16/2019   Procedure: CYSTOSCOPY WITH URETHRAL BALLOON DILATATION; FULGERATION AND TRANSURETHRAL RESECTION OF PROSTATE;  Surgeon: Irine Seal, MD;  Location: [facility];  Service: Urology;  Laterality: N/A;   NO PAST SURGERIES         No family history on file.  Social History   Tobacco Use   Smoking status: Never Smoker   Smokeless tobacco: Never Used  Substance Use Topics   Alcohol use: No   Drug use: Not Currently    Home Medications Prior to Admission medications   Medication Sig Start Date End Date Taking? Authorizing Provider  HYDROcodone-acetaminophen (NORCO/VICODIN) 5-325 MG tablet Take 1 tablet by mouth every 6 (six) hours as needed.  10/30/19   Danissa Rundle, PA-C  oxybutynin (DITROPAN) 5 MG tablet Take 1 tablet (5 mg total) by mouth every 12 (twelve) hours as needed for bladder spasms. 10/30/19   Kandra Graven, PA-C  phenazopyridine (PYRIDIUM) 95 MG tablet Take 1 tablet (95 mg total) by mouth 3 (three) times daily as needed for pain. 10/30/19   Delia Heady, PA-C    Allergies    Patient has no known allergies.  Review of Systems   Review of Systems  Constitutional: Negative for appetite change, chills and fever.  HENT: Negative for ear pain, rhinorrhea, sneezing and sore throat.   Eyes: Negative for photophobia and visual disturbance.  Respiratory: Negative for cough, chest tightness, shortness of breath and wheezing.   Cardiovascular: Negative for chest pain and palpitations.  Gastrointestinal: Negative for abdominal pain, blood in stool, constipation, diarrhea, nausea and vomiting.  Genitourinary: Positive for dysuria and hematuria. Negative for urgency.  Musculoskeletal: Negative for myalgias.  Skin: Negative for rash.  Neurological: Negative for dizziness, weakness and light-headedness.    Physical Exam Updated Vital Signs BP (!) 169/81    Pulse 70    Temp 98.9 F (37.2 C) (Oral)    Resp 16    SpO2 97%   Physical Exam Vitals and nursing note reviewed.  Constitutional:      General: He is not in acute distress.    Appearance: He is well-developed.  HENT:     Head: Normocephalic and atraumatic.     Nose: Nose  normal.  Eyes:     General: No scleral icterus.       Left eye: No discharge.     Conjunctiva/sclera: Conjunctivae normal.  Cardiovascular:     Rate and Rhythm: Normal rate and regular rhythm.     Heart sounds: Normal heart sounds. No murmur. No friction rub. No gallop.   Pulmonary:     Effort: Pulmonary effort is normal. No respiratory distress.     Breath sounds: Normal breath sounds.  Abdominal:     General: Bowel sounds are normal. There is no distension.     Palpations: Abdomen is soft.       Tenderness: There is no abdominal tenderness. There is no guarding.  Musculoskeletal:        General: Normal range of motion.     Cervical back: Normal range of motion and neck supple.  Skin:    General: Skin is warm and dry.     Findings: No rash.  Neurological:     Mental Status: He is alert.     Motor: No abnormal muscle tone.     Coordination: Coordination normal.     ED Results / Procedures / Treatments   Labs (all labs ordered are listed, but only abnormal results are displayed) Labs Reviewed  URINALYSIS, ROUTINE W REFLEX MICROSCOPIC - Abnormal; Notable for the following components:      Result Value   Color, Urine RED (*)    APPearance TURBID (*)    Glucose, UA   (*)    Value: TEST NOT REPORTED DUE TO COLOR INTERFERENCE OF URINE PIGMENT   Hgb urine dipstick   (*)    Value: TEST NOT REPORTED DUE TO COLOR INTERFERENCE OF URINE PIGMENT   Bilirubin Urine   (*)    Value: TEST NOT REPORTED DUE TO COLOR INTERFERENCE OF URINE PIGMENT   Ketones, ur   (*)    Value: TEST NOT REPORTED DUE TO COLOR INTERFERENCE OF URINE PIGMENT   Protein, ur   (*)    Value: TEST NOT REPORTED DUE TO COLOR INTERFERENCE OF URINE PIGMENT   Nitrite   (*)    Value: TEST NOT REPORTED DUE TO COLOR INTERFERENCE OF URINE PIGMENT   Leukocytes,Ua   (*)    Value: TEST NOT REPORTED DUE TO COLOR INTERFERENCE OF URINE PIGMENT   All other components within normal limits  LIPASE, BLOOD - Abnormal; Notable for the following components:   Lipase 52 (*)    All other components within normal limits  COMPREHENSIVE METABOLIC PANEL - Abnormal; Notable for the following components:   Glucose, Bld 120 (*)    Calcium 8.6 (*)    Total Protein 6.1 (*)    ALT 46 (*)    All other components within normal limits  CBC - Abnormal; Notable for the following components:   WBC 12.7 (*)    All other components within normal limits  URINALYSIS, MICROSCOPIC (REFLEX) - Abnormal; Notable for the following components:    Bacteria, UA FEW (*)    All other components within normal limits  URINE CULTURE    EKG None  Radiology No results found.  Procedures Procedures (including critical care time)  Medications Ordered in ED Medications  sodium chloride flush (NS) 0.9 % injection 3 mL (3 mLs Intravenous Given 10/30/19 1747)  morphine 4 MG/ML injection 4 mg (4 mg Intravenous Given 10/30/19 1750)  oxybutynin (DITROPAN) tablet 5 mg (5 mg Oral Given 10/30/19 1913)  phenazopyridine (PYRIDIUM) tablet 100 mg (100 mg  Oral Given 10/30/19 1913)    ED Course  I have reviewed the triage vital signs and the nursing notes.  Pertinent labs & imaging results that were available during my care of the patient were reviewed by me and considered in my medical decision making (see chart for details).  Clinical Course as of Oct 29 1932  Thu Oct 30, 2019  1816 Post void residual is 204cc.   [HK]  [place] to Dr. Lovena Neighbours of urology. Consider bladder spasms that patient is experiencing.  Will place patient on Pyridium, oxybutynin and pain medication have him follow-up with Dr. Jeffie Pollock per urology recommendations.   [HK]    Clinical Course User Index [HK] Delia Heady, PA-C   MDM Rules/Calculators/A&P                      63 year old male who is status post TURP procedure on 10/16/2019 presents to the ED for ongoing hematuria and dysuria.  He followed up with his urologist 1 week ago and is placed on antibiotics and given more pain medication.  He is concerned due to his ongoing hematuria and discomfort with urination.  Denies any obstructive symptoms as he is still able to urinate.  Denies fevers, abdominal pain or shortness of breath.  His vital signs within normal limits.  He is overall well-appearing on exam.  Work-up significant for mild leukocytosis of 12.7, urinalysis inconclusive due to color but did show some pyuria and bacteriuria. Per urology recommendations will place patient on oxybutynin, Pyridium and pain  medication have him follow-up with Dr. Jeffie Pollock in the office. Patient agreeable to plan, but did voice frustration because his symptoms are preventing him from traveling this winter. I understand his frustration but did inform him that following up with his urologist would be the best option. In the meantime will try to manage these symptoms with medications.  Patient is hemodynamically stable, in NAD, and able to ambulate in the ED. Evaluation does not show pathology that would require ongoing emergent intervention or inpatient treatment. I explained the diagnosis to the patient. Pain has been managed and has no complaints prior to discharge. Patient is comfortable with above plan and is stable for discharge at this time. All questions were answered prior to disposition. Strict return precautions for returning to the ED were discussed. Encouraged follow up with PCP.   An After Visit Summary was printed and given to the patient.   Portions of this note were generated with [employer]. Dictation errors may occur despite best attempts at proofreading.  Final Clinical Impression(s) / ED Diagnoses Final diagnoses:  Bladder spasms    Rx / DC Orders ED Discharge Orders         Ordered    oxybutynin (DITROPAN) 5 MG tablet  Every 12 hours PRN     10/30/19 1927    phenazopyridine (PYRIDIUM) 200 MG tablet  3 times daily,   Status:  Discontinued     10/30/19 1927    phenazopyridine (PYRIDIUM) 95 MG tablet  3 times daily PRN     10/30/19 1928    HYDROcodone-acetaminophen (NORCO/VICODIN) 5-325 MG tablet  Every 6 hours PRN     10/30/19 1930           Delia Heady, PA-C 10/30/19 1933    Margette Fast, MD 10/31/19 [phone]

## 2019-11-03 LAB — URINE CULTURE: Culture: >=100000 — AB

## 2019-11-04 ENCOUNTER — Telehealth: Payer: Self-pay

## 2019-11-04 NOTE — Progress Notes (Signed)
ED Antimicrobial Stewardship Positive Culture Follow Up   Douglas Lewis is an 63 y.o. male who presented to [facility] on 10/30/2019 with a chief complaint of No chief complaint on file.   Recent Results (from the past 720 hour(s))  Novel Coronavirus, NAA (Hosp order, Send-out to Ref Lab; TAT 18-24 hrs     Status: None   Collection Time: 10/13/19 10:48 AM   Specimen: Nasopharyngeal Swab; Respiratory  Result Value Ref Range Status   SARS-CoV-2, NAA NOT DETECTED NOT DETECTED Final    Comment: (NOTE) This nucleic acid amplification test was developed and its performance characteristics determined by [employer]. Nucleic acid amplification tests include PCR and TMA. This test has not been FDA cleared or approved. This test has been authorized by FDA under an Emergency Use Authorization (EUA). This test is only authorized for the duration of time the declaration that circumstances exist justifying the authorization of the emergency use of in vitro diagnostic tests for detection of SARS-CoV-2 virus and/or diagnosis of COVID-19 infection under section 564(b)(1) of the Act, 21 U.S.C. [id number]) (1), unless the authorization is terminated or revoked sooner. When diagnostic testing is negative, the possibility of a false negative result should be considered in the context of a patient's recent exposures and the presence of clinical signs and symptoms consistent with COVID-19. An individual without symptoms of COVID- 19 and who is not shedding SARS-CoV-2 vi rus would expect to have a negative (not detected) result in this assay. Performed At: [facility] [address] [place], [place] [id number] [name] MD [id number]    [name]  Final    Comment: Performed at [facility], [place] [address], [place], [place] [address]  Urine culture     Status: Abnormal   Collection Time: 10/30/19  4:56 PM   Specimen: Urine, Random  Result Value  Ref Range Status   Specimen Description URINE, RANDOM  Final   Special Requests   Final    NONE Performed at [facility], [place] [address] [place], [place] [address]    Culture (A)  Final    >=100,000 COLONIES/mL KLEBSIELLA OXYTOCA 50,000 COLONIES/mL STENOTROPHOMONAS MALTOPHILIA    Report Status 11/03/2019 FINAL  Final   Organism ID, Bacteria KLEBSIELLA OXYTOCA (A)  Final   Organism ID, Bacteria STENOTROPHOMONAS MALTOPHILIA (A)  Final      Susceptibility   Klebsiella oxytoca - MIC*    AMPICILLIN RESISTANT Resistant     CEFAZOLIN <=4 SENSITIVE Sensitive     CEFTRIAXONE <=1 SENSITIVE Sensitive     CIPROFLOXACIN <=0.25 SENSITIVE Sensitive     GENTAMICIN <=1 SENSITIVE Sensitive     IMIPENEM <=0.25 SENSITIVE Sensitive     NITROFURANTOIN <=16 SENSITIVE Sensitive     TRIMETH/SULFA >=320 RESISTANT Resistant     AMPICILLIN/SULBACTAM 8 SENSITIVE Sensitive     PIP/TAZO <=4 SENSITIVE Sensitive     * >=100,000 COLONIES/mL KLEBSIELLA OXYTOCA   Stenotrophomonas maltophilia - MIC*    LEVOFLOXACIN 1 SENSITIVE Sensitive     TRIMETH/SULFA <=20 SENSITIVE Sensitive     * 50,000 COLONIES/mL STENOTROPHOMONAS MALTOPHILIA    [x]  Treated with Bactrim, organism resistant to prescribed antimicrobial  New antibiotic prescription: If patient has not already followed up with his Urologist and been prescribed new antibiotic - Stop Bactrim if still taking this medication. Start levofloxacin 750mg  PO daily x 5 days.  ED Provider: Benedetto Goad, PA-C   Romona Curls 11/04/2019, 8:22 AM Clinical Pharmacist Monday - Friday phone -  [phone] Saturday - Sunday phone - [phone]

## 2019-11-04 NOTE — Telephone Encounter (Signed)
Unable to reach pt for F/U on [facility] 10/30/2019 . Pt needs abx Levofloxacin 750 mg qday x 5 days if not seen by Uro and put on abx treatment. Per Benedetto Goad PA  Will letter to home

## 2019-12-01 ENCOUNTER — Other Ambulatory Visit: Payer: Self-pay | Admitting: Urology

## 2019-12-01 ENCOUNTER — Other Ambulatory Visit: Payer: Self-pay

## 2019-12-01 ENCOUNTER — Other Ambulatory Visit (HOSPITAL_COMMUNITY)
Admission: RE | Admit: 2019-12-01 | Discharge: 2019-12-01 | Disposition: A | Discharge status: Home / Self Care | Payer: Medicare Other | Source: Ambulatory Visit | Attending: Urology | Admitting: Urology

## 2019-12-01 ENCOUNTER — Encounter (HOSPITAL_BASED_OUTPATIENT_CLINIC_OR_DEPARTMENT_OTHER): Payer: Self-pay | Admitting: Urology

## 2019-12-01 DIAGNOSIS — Z01812 Encounter for preprocedural laboratory examination: Secondary | ICD-10-CM | POA: Insufficient documentation

## 2019-12-01 DIAGNOSIS — Z20822 Contact with and (suspected) exposure to covid-19: Secondary | ICD-10-CM | POA: Diagnosis not present

## 2019-12-01 LAB — SARS CORONAVIRUS 2 (TAT 6-24 HRS): SARS Coronavirus 2: NEGATIVE

## 2019-12-01 NOTE — Progress Notes (Signed)
Spoke with: Maron NPO:  No food after midnight/Clear liquids until 8:00 AM DOS Arrival time: 1215 PM Labs:N/A AM medications: None Pre op orders: Yes Ride home: Ermalinda Barrios (significant other) [phone]

## 2019-12-03 NOTE — H&P (Signed)
CC: I have pain or burning with urination.  HPI: Douglas Lewis is a 64 year-old male established patient who is here for pain or burning while urinating.  He does have pain or burning when he urinates.   [name] returns today in f/u. He continues to have dysuria that can be severe and is worse in the meatal area. He has persistent pyuria with minimal hematuria. His culture from last week was negative.      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 He is doing better with decreased LUTS but has some urgency. I will get a UTI+ because of the persistent dysuria and urgency with pyuria. - 11/24/2019, - 11/17/2019, He has persistent hematuria and dysuria. I am going to add finasteride and reviewed the side effects. He will return in a couple of weeks and if he doesn't improve we will need to consider cystoscopy., - 11/06/2019, - 10/23/2019, He has had no further hematuria. , - 10/06/2019, The hematuria has resolved but he will need cystoscopy to complete the evaluation so I will have him return following the imaging for a flowrate and cystoscopy. I have sent valium since he is quite anxious. , - 07/23/2019 Dysuria, Urine culture sent. - 11/06/2019 Low back pain, The pain may be related to the lithotomy position. I will send flexeril and have refilled the oxycodone. 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 I have reviewed the risks of bleeding, infection,  recurrent stricture, thrombotic events and anesthetic complications. . - 10/06/2019 BPH w/LUTS, He has a large prostate with a benign consistency on exam and normal PSA. I don't see a need for an [place] and biopsy at this time. He doesn't want to start treatment at this time despite his LUTS. - 07/23/2019 Urinary Urgency - 07/23/2019    NON-GU PMH: Abnormal findings on diagnostic imaging of liver and biliary tract - 07/23/2019 Abnormal findings on diagnostic imaging of other abdominal regions, including retroperitoneum, He has lung, liver and splenic lesions and a CT Chest and MRI Abd were recommmended so I have ordered those, but he reports the splenic lesion is long standing but it has enlarged since his last imaging. - 07/23/2019 Solitary pulmonary nodule - 07/23/2019 Anxiety Arthritis    FAMILY HISTORY: Breast Cancer - Sister ovarian cancer - Mother   SOCIAL HISTORY: Marital Status: Married Preferred Language: English; Ethnicity: Not Hispanic Or Latino; Race: White Current Smoking Status: Patient has never smoked.   Tobacco Use Assessment Completed: Used Tobacco in last 30 days? Drinks 2 caffeinated drinks per day. 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 Patient denies frequent urination, hard to postpone urination, get up at night to urinate, stream starts and stops, trouble starting your stream, have to strain to urinate , erection problems, and penile  pain.  Gastrointestinal (Upper):   Patient denies nausea, vomiting, and indigestion/ heartburn.  Gastrointestinal (Lower):   Patient denies diarrhea and constipation.  Constitutional:   Patient denies fever, night sweats, weight loss, and fatigue.  Skin:   Patient denies skin rash/ lesion and itching.  Eyes:   Patient denies double vision and blurred vision.  Ears/ Nose/ Throat:   Patient denies sore  throat and sinus problems.  Hematologic/Lymphatic:   Patient denies swollen glands and easy bruising.  Cardiovascular:   Patient denies leg swelling and chest pains.  Respiratory:   Patient denies cough and shortness of breath.  Endocrine:   Patient denies excessive thirst.  Musculoskeletal:   Patient denies back pain and joint pain.  Neurological:   Patient denies headaches and dizziness.  Psychologic:   Patient denies depression and anxiety.   VITAL SIGNS:      12/01/2019 07:47 AM  BP 151/80 mmHg  Pulse 76 /min  Temperature 97.7 F / 36.5 C   PAST DATA REVIEWED:  Source Of History:  Patient   PROCEDURES:         Flexible Cystoscopy - 52000  Risks, benefits, and some of the potential complications of the procedure were discussed. 58ml of 2% lidocaine jelly was instilled intraurethrally. Cipro 500mg  given for antibiotic prophylaxis.       Meatus:  Normal size. Normal location. Normal condition.  Urethra:  Severe membranous stricture that I couldn't pass because of the caliber and patient discomfort.       I was unable to visualize any structures proximal to the membranous sphincter.   The procedure was well tolerated and there were no complications.         Urinalysis w/Scope Dipstick Dipstick Cont'd Micro  Color: Yellow Bilirubin: Neg mg/dL WBC/hpf: >60/hpf  Appearance: Cloudy Ketones: Neg mg/dL RBC/hpf: 10 - 20/hpf  Specific Gravity: 1.025 Blood: 2+ ery/uL Bacteria: Mod (26-50/hpf)  pH: <=5.0 Protein: Trace mg/dL Cystals: NS (Not Seen)  Glucose: Neg mg/dL Urobilinogen: 0.2 mg/dL Casts: NS (Not Seen)    Nitrites: Neg Trichomonas: Not Present    Leukocyte Esterase: 3+ leu/uL Mucous: Not Present      Epithelial Cells: 0 - 5/hpf      Yeast: NS (Not Seen)      Sperm: Not Present    ASSESSMENT:      ICD-10 Details  1 GU:   Dysuria - R30.0 Chronic, Worsening - He has a membranous stricture that is the probable cause of the dysuria.   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 I reviewed the risks of the procedure in detail He called back and requested pain meds for the post cystoscopy pain. A small amount of hydrocodone was sent.      PLAN:            Medications Refill Meds: Cyclobenzaprine Hcl 10 mg tablet 1 tablet PO Q 8 H PRN   #20  0 Refill(s)    New Meds: Hydrocodone-Acetaminophen 5 mg-325 mg tablet 1 tablet PO Q 6 H PRN   #12  0 Refill(s)            Schedule Return Visit/Planned Activity: ASAP - Schedule Surgery

## 2019-12-04 ENCOUNTER — Encounter (HOSPITAL_BASED_OUTPATIENT_CLINIC_OR_DEPARTMENT_OTHER): Payer: Self-pay | Admitting: Urology

## 2019-12-04 ENCOUNTER — Ambulatory Visit (HOSPITAL_BASED_OUTPATIENT_CLINIC_OR_DEPARTMENT_OTHER)
Admission: RE | Admit: 2019-12-04 | Discharge: 2019-12-04 | Disposition: A | Discharge status: Home / Self Care | Payer: Medicare Other | Attending: Urology | Admitting: Urology

## 2019-12-04 ENCOUNTER — Ambulatory Visit (HOSPITAL_BASED_OUTPATIENT_CLINIC_OR_DEPARTMENT_OTHER): Payer: Medicare Other | Admitting: Certified Registered Nurse Anesthetist

## 2019-12-04 ENCOUNTER — Encounter (HOSPITAL_BASED_OUTPATIENT_CLINIC_OR_DEPARTMENT_OTHER)
Admission: RE | Disposition: A | Discharge status: Home / Self Care | Payer: Self-pay | Source: Home / Self Care | Attending: Urology

## 2019-12-04 ENCOUNTER — Other Ambulatory Visit: Payer: Self-pay

## 2019-12-04 DIAGNOSIS — R319 Hematuria, unspecified: Secondary | ICD-10-CM | POA: Diagnosis not present

## 2019-12-04 DIAGNOSIS — N99111 Postprocedural bulbous urethral stricture: Secondary | ICD-10-CM | POA: Insufficient documentation

## 2019-12-04 DIAGNOSIS — M1990 Unspecified osteoarthritis, unspecified site: Secondary | ICD-10-CM | POA: Diagnosis not present

## 2019-12-04 DIAGNOSIS — Z8041 Family history of malignant neoplasm of ovary: Secondary | ICD-10-CM | POA: Diagnosis not present

## 2019-12-04 DIAGNOSIS — Z803 Family history of malignant neoplasm of breast: Secondary | ICD-10-CM | POA: Diagnosis not present

## 2019-12-04 DIAGNOSIS — F419 Anxiety disorder, unspecified: Secondary | ICD-10-CM | POA: Diagnosis not present

## 2019-12-04 DIAGNOSIS — Z79899 Other long term (current) drug therapy: Secondary | ICD-10-CM | POA: Insufficient documentation

## 2019-12-04 DIAGNOSIS — R300 Dysuria: Secondary | ICD-10-CM | POA: Insufficient documentation

## 2019-12-04 DIAGNOSIS — N35912 Unspecified bulbous urethral stricture, male: Secondary | ICD-10-CM | POA: Diagnosis present

## 2019-12-04 HISTORY — PX: CYSTOSCOPY WITH URETHRAL DILATATION: SHX5125

## 2019-12-04 HISTORY — DX: Personal history of other diseases of male genital organs: Z87.438

## 2019-12-04 SURGERY — CYSTOSCOPY, WITH URETHRAL DILATION
Anesthesia: General | Site: Urethra

## 2019-12-04 MED ORDER — CEFAZOLIN SODIUM-DEXTROSE 2-4 GM/100ML-% IV SOLN
2.0000 g | INTRAVENOUS | Status: AC
  Administered 2019-12-04: 14:00:00 2 g via INTRAVENOUS
  Filled 2019-12-04: qty 100

## 2019-12-04 MED ORDER — OXYCODONE HCL 5 MG PO TABS
5.0000 mg | ORAL_TABLET | ORAL | Status: DC | PRN
  Administered 2019-12-04: 15:00:00 5 mg via ORAL
  Filled 2019-12-04: qty 2

## 2019-12-04 MED ORDER — ACETAMINOPHEN 325 MG PO TABS
650.0000 mg | ORAL_TABLET | ORAL | Status: DC | PRN
  Filled 2019-12-04: qty 2

## 2019-12-04 MED ORDER — LACTATED RINGERS IV SOLN
INTRAVENOUS | Status: DC
  Filled 2019-12-04: qty 1000

## 2019-12-04 MED ORDER — SODIUM CHLORIDE 0.9 % IV SOLN
250.0000 mL | INTRAVENOUS | Status: DC | PRN
  Filled 2019-12-04: qty 250

## 2019-12-04 MED ORDER — SODIUM CHLORIDE 0.9% FLUSH
3.0000 mL | INTRAVENOUS | Status: DC | PRN
  Filled 2019-12-04: qty 3

## 2019-12-04 MED ORDER — CEFAZOLIN SODIUM-DEXTROSE 2-4 GM/100ML-% IV SOLN
INTRAVENOUS | Status: AC
  Filled 2019-12-04: qty 100

## 2019-12-04 MED ORDER — FENTANYL CITRATE (PF) 100 MCG/2ML IJ SOLN
INTRAMUSCULAR | Status: DC | PRN
  Administered 2019-12-04 (×2): 50 ug via INTRAVENOUS

## 2019-12-04 MED ORDER — OXYCODONE HCL 5 MG PO TABS
ORAL_TABLET | ORAL | Status: AC
  Filled 2019-12-04: qty 1

## 2019-12-04 MED ORDER — MIDAZOLAM HCL 2 MG/2ML IJ SOLN
INTRAMUSCULAR | Status: DC | PRN
  Administered 2019-12-04: 2 mg via INTRAVENOUS

## 2019-12-04 MED ORDER — ONDANSETRON HCL 4 MG/2ML IJ SOLN
INTRAMUSCULAR | Status: DC | PRN
  Administered 2019-12-04: 4 mg via INTRAVENOUS

## 2019-12-04 MED ORDER — HYDROCODONE-ACETAMINOPHEN 5-325 MG PO TABS: 1.0000 | ORAL_TABLET | Freq: Four times a day (QID) | ORAL | 0 refills | Status: DC | PRN

## 2019-12-04 MED ORDER — ONDANSETRON HCL 4 MG/2ML IJ SOLN
4.0000 mg | Freq: Once | INTRAMUSCULAR | Status: DC | PRN
  Filled 2019-12-04: qty 2

## 2019-12-04 MED ORDER — ONDANSETRON HCL 4 MG/2ML IJ SOLN
INTRAMUSCULAR | Status: AC
  Filled 2019-12-04: qty 2

## 2019-12-04 MED ORDER — DEXAMETHASONE SODIUM PHOSPHATE 10 MG/ML IJ SOLN
INTRAMUSCULAR | Status: DC | PRN
  Administered 2019-12-04 (×2): 5 mg via INTRAVENOUS

## 2019-12-04 MED ORDER — SODIUM CHLORIDE 0.9% FLUSH
3.0000 mL | Freq: Two times a day (BID) | INTRAVENOUS | Status: DC
  Filled 2019-12-04: qty 3

## 2019-12-04 MED ORDER — MIDAZOLAM HCL 2 MG/2ML IJ SOLN
INTRAMUSCULAR | Status: AC
  Filled 2019-12-04: qty 2

## 2019-12-04 MED ORDER — MORPHINE SULFATE (PF) 2 MG/ML IV SOLN
2.0000 mg | INTRAVENOUS | Status: DC | PRN
  Filled 2019-12-04: qty 1

## 2019-12-04 MED ORDER — FENTANYL CITRATE (PF) 100 MCG/2ML IJ SOLN
INTRAMUSCULAR | Status: AC
  Filled 2019-12-04: qty 2

## 2019-12-04 MED ORDER — PROPOFOL 10 MG/ML IV BOLUS
INTRAVENOUS | Status: DC | PRN
  Administered 2019-12-04: 150 mg via INTRAVENOUS

## 2019-12-04 MED ORDER — DEXAMETHASONE SODIUM PHOSPHATE 10 MG/ML IJ SOLN
INTRAMUSCULAR | Status: AC
  Filled 2019-12-04: qty 1

## 2019-12-04 MED ORDER — FENTANYL CITRATE (PF) 100 MCG/2ML IJ SOLN
25.0000 ug | INTRAMUSCULAR | Status: DC | PRN
  Filled 2019-12-04: qty 1

## 2019-12-04 MED ORDER — PHENAZOPYRIDINE HCL 95 MG PO TABS: 95.0000 mg | ORAL_TABLET | Freq: Three times a day (TID) | ORAL | 1 refills | Status: DC | PRN

## 2019-12-04 MED ORDER — ACETAMINOPHEN 650 MG RE SUPP
650.0000 mg | RECTAL | Status: DC | PRN
  Filled 2019-12-04: qty 1

## 2019-12-04 MED ORDER — LIDOCAINE 2% (20 MG/ML) 5 ML SYRINGE
INTRAMUSCULAR | Status: AC
  Filled 2019-12-04: qty 5

## 2019-12-04 MED ORDER — LIDOCAINE 2% (20 MG/ML) 5 ML SYRINGE
INTRAMUSCULAR | Status: DC | PRN
  Administered 2019-12-04: 60 mg via INTRAVENOUS

## 2019-12-04 MED ORDER — STERILE WATER FOR IRRIGATION IR SOLN
Status: DC | PRN
  Administered 2019-12-04: 6000 mL via INTRAVESICAL

## 2019-12-04 MED ORDER — PROPOFOL 10 MG/ML IV BOLUS
INTRAVENOUS | Status: AC
  Filled 2019-12-04: qty 20

## 2019-12-04 SURGICAL SUPPLY — 23 items
BAG DRAIN URO-CYSTO SKYTR STRL (DRAIN) ×2 IMPLANT
BALLN NEPHROSTOMY (BALLOONS)
BALLOON NEPHROSTOMY (BALLOONS) IMPLANT
CATH FOLEY 2WAY SLVR  5CC 16FR (CATHETERS)
CATH FOLEY 2WAY SLVR 5CC 16FR (CATHETERS) IMPLANT
CLOTH BEACON ORANGE TIMEOUT ST (SAFETY) ×2 IMPLANT
ELECT REM PT RETURN 9FT ADLT (ELECTROSURGICAL) ×2
ELECTRODE REM PT RTRN 9FT ADLT (ELECTROSURGICAL) ×1 IMPLANT
GLOVE SURG SS PI 8.0 STRL IVOR (GLOVE) ×2 IMPLANT
GOWN STRL REUS W/TWL XL LVL3 (GOWN DISPOSABLE) ×2 IMPLANT
GUIDEWIRE STR DUAL SENSOR (WIRE) IMPLANT
KIT TURNOVER CYSTO (KITS) ×2 IMPLANT
LOOP CUT BIPOLAR 24F LRG (ELECTROSURGICAL) ×2 IMPLANT
MANIFOLD NEPTUNE II (INSTRUMENTS) IMPLANT
NDL SAFETY ECLIPSE 18X1.5 (NEEDLE) IMPLANT
NEEDLE HYPO 18GX1.5 SHARP (NEEDLE)
NEEDLE HYPO 22GX1.5 SAFETY (NEEDLE) IMPLANT
NS IRRIG 500ML POUR BTL (IV SOLUTION) IMPLANT
PACK CYSTO (CUSTOM PROCEDURE TRAY) ×2 IMPLANT
SYR 20ML LL LF (SYRINGE) IMPLANT
SYR 30ML LL (SYRINGE) IMPLANT
TUBE CONNECTING 12X1/4 (SUCTIONS) IMPLANT
WATER STERILE IRR 3000ML UROMA (IV SOLUTION) ×4 IMPLANT

## 2019-12-04 NOTE — Anesthesia Postprocedure Evaluation (Signed)
Anesthesia Post Note  Patient: Douglas Lewis  Procedure(s) Performed: CYSTOSCOPY WITH URETHRAL DILATATION, REMOVAL OF FOREIGN BODY (N/A Urethra)     Patient location during evaluation: PACU Anesthesia Type: General Level of consciousness: awake and alert Pain management: pain level controlled Vital Signs Assessment: post-procedure vital signs reviewed and stable Respiratory status: spontaneous breathing, nonlabored ventilation, respiratory function stable and patient connected to nasal cannula oxygen Cardiovascular status: blood pressure returned to baseline and stable Postop Assessment: no apparent nausea or vomiting Anesthetic complications: no    Last Vitals:  Vitals:   12/04/19 1500 12/04/19 1530  BP: (!) 156/84 (!) 151/91  Pulse: 67 72  Resp: 12 12  Temp:  (!) 36.4 C  SpO2: 97% 99%    Last Pain:  Vitals:   12/04/19 1523  TempSrc:   PainSc: 5                  Tiajuana Amass

## 2019-12-04 NOTE — Anesthesia Preprocedure Evaluation (Signed)
Anesthesia Evaluation  Patient identified by MRN, date of birth, ID band Patient awake    Reviewed: Allergy & Precautions, NPO status , Patient's Chart, lab work & pertinent test results  History of Anesthesia Complications Negative for: history of anesthetic complications  Airway Mallampati: II  TM Distance: >3 FB Neck ROM: Full    Dental  (+) Dental Advisory Given, Missing   Pulmonary neg pulmonary ROS,    Pulmonary exam normal        Cardiovascular negative cardio ROS Normal cardiovascular exam     Neuro/Psych negative neurological ROS  negative psych ROS   GI/Hepatic negative GI ROS, Neg liver ROS,   Endo/Other  negative endocrine ROS  Renal/GU negative Renal ROS    Ureteral stricture     Musculoskeletal negative musculoskeletal ROS (+)   Abdominal   Peds  Hematology negative hematology ROS (+)   Anesthesia Other Findings   Reproductive/Obstetrics                             Anesthesia Physical  Anesthesia Plan  ASA: I  Anesthesia Plan: General   Post-op Pain Management:    Induction: Intravenous  PONV Risk Score and Plan: 3 and Treatment may vary due to age or medical condition, Ondansetron and Dexamethasone  Airway Management Planned: LMA  Additional Equipment: None  Intra-op Plan:   Post-operative Plan: Extubation in OR  Informed Consent: I have reviewed the patients History and Physical, chart, labs and discussed the procedure including the risks, benefits and alternatives for the proposed anesthesia with the patient or authorized representative who has indicated his/her understanding and acceptance.     Dental advisory given  Plan Discussed with: CRNA and Anesthesiologist  Anesthesia Plan Comments:         Anesthesia Quick Evaluation

## 2019-12-04 NOTE — Op Note (Signed)
Procedure: 1.  Cystoscopy with dilation of bulbar urethral stricture. 2.  Cystoscopy with removal of foreign body, complicated. 3.  Cystoscopy with fulguration.  Preop diagnosis: Persistent dysuria status post TURP with recurrent bulbar urethral stricture.   Postop diagnosis: Recurrent bulbar urethral stricture with extensive fibrinous debris within the prostatic urethra.  Surgeon: Dr. Irine Seal.  Anesthesia: General.  Specimen: None.  Drain: None.  EBL: None.  Complications: None.  Indications: The patient is a 64 year old male who had dilation of the bulbar urethral stricture and at surgery was found to have a large prostate middle lobe with bleeding that required a partial TURP late last year and has had persistent issues with dysuria and irritative voiding symptoms.  Recent office cystoscopy demonstrated recurrent bulbar urethral stricture.  He was unable to tolerate instrumentation in the office.  It was felt that dilation of the stricture and completion of cystoscopy was indicated.  Procedure: He was given 2 g of Ancef.  A general anesthetic was induced.  He was placed in lithotomy position and fitted with PAS hose.  His perineum and genitalia were prepped with Betadine solution he was draped in usual sterile fashion.  Cystoscopy was performed using a 23 [place] scope and the 30 degree lens.  Examination revealed a normal anterior urethra.  In the bulb there was approximately 1 and half centimeter narrowed area that appeared to be about 12 [place].  I advanced a sensor wire through the stricture and was able to then passed the cystoscope through disrupting the stricture.  The external sphincter was intact.  The prostatic urethra had evidence of resection of the proximal lateral lobes and middle lobe with an excellent TUR defect but there was extensive fibrinous debris filling the TUR defect and it was felt that this needed to be removed to aid the patient's symptoms.  The cystoscope  was removed and a 26 French continuous-flow resectoscope sheath was passed per urethra.  I was able to advance it by the strictured area further dilating the bulbar urethra.  Once the instrument was within the bladder the obturator was removed and was replaced with a Beatrix Fetters handle with a 30 degree lens and a bipolar loop.  Saline was used as the irrigant.  The bulk of the exuberant fibrinous exudate was cold looped off of the resection defect and then aspirated from the bladder.  Some areas were slightly more adherent and required gentle resection.  Once all of the excess material was removed some areas of bleeding were gently fulgurated for hemostasis.  At this point final inspection revealed no retained debris.  The prostatic urethral defect was significantly more patent and there was no active bleeding.  The resectoscope was then removed and it was not felt that a Foley catheter was required.  He was taken down from lithotomy position, his anesthetic was reversed and he was moved recovery room in stable condition.  There were no complications.

## 2019-12-04 NOTE — Discharge Instructions (Addendum)
CYSTOSCOPY HOME CARE INSTRUCTIONS ° °Activity: °Rest for the remainder of the day.  Do not drive or operate equipment today.  You may resume normal activities in one to two days as instructed by your physician.  ° °Meals: °Drink plenty of liquids and eat light foods such as gelatin or soup this evening.  You may return to a normal meal plan tomorrow. ° °Return to Work: °You may return to work in one to two days or as instructed by your physician. ° °Special Instructions / Symptoms: °Call your physician if any of these symptoms occur: ° ° -persistent or heavy bleeding ° -bleeding which continues after first few urination ° -large blood clots that are difficult to pass ° -urine stream diminishes or stops completely ° -fever equal to or higher than 101 degrees Farenheit. ° -cloudy urine with a strong, foul odor ° -severe pain ° °Females should always wipe from front to back after elimination.  You may feel some burning pain when you urinate.  This should disappear with time.  Applying moist heat to the lower abdomen or a hot tub bath may help relieve the pain. \ ° ° ° ° °Post Anesthesia Home Care Instructions ° °Activity: °Get plenty of rest for the remainder of the day. A responsible adult should stay with you for 24 hours following the procedure.  °For the next 24 hours, DO NOT: °-Drive a car °-Operate machinery °-Drink alcoholic beverages °-Take any medication unless instructed by your physician °-Make any legal decisions or sign important papers. ° °Meals: °Start with liquid foods such as gelatin or soup. Progress to regular foods as tolerated. Avoid greasy, spicy, heavy foods. If nausea and/or vomiting occur, drink only clear liquids until the nausea and/or vomiting subsides. Call your physician if vomiting continues. ° °Special Instructions/Symptoms: °Your throat may feel dry or sore from the anesthesia or the breathing tube placed in your throat during surgery. If this causes discomfort, gargle with warm salt  water. The discomfort should disappear within 24 hours. ° °If you had a scopolamine patch placed behind your ear for the management of post- operative nausea and/or vomiting: ° °1. The medication in the patch is effective for 72 hours, after which it should be removed.  Wrap patch in a tissue and discard in the trash. Wash hands thoroughly with soap and water. °2. You may remove the patch earlier than 72 hours if you experience unpleasant side effects which may include dry mouth, dizziness or visual disturbances. °3. Avoid touching the patch. Wash your hands with soap and water after contact with the patch. °  ° °

## 2019-12-04 NOTE — Anesthesia Procedure Notes (Signed)
Procedure Name: LMA Insertion Date/Time: 12/04/2019 1:51 PM Performed by: Suan Halter, CRNA Pre-anesthesia Checklist: Patient identified, Emergency Drugs available, Suction available and Patient being monitored Patient Re-evaluated:Patient Re-evaluated prior to induction Oxygen Delivery Method: Circle system utilized Preoxygenation: Pre-oxygenation with 100% oxygen Induction Type: IV induction Ventilation: Mask ventilation without difficulty LMA: LMA inserted LMA Size: 5.0 Number of attempts: 1 Airway Equipment and Method: Bite block Placement Confirmation: positive ETCO2 Tube secured with: Tape Dental Injury: Teeth and Oropharynx as per pre-operative assessment

## 2019-12-04 NOTE — Transfer of Care (Signed)
Immediate Anesthesia Transfer of Care Note  Patient: Douglas Lewis  Procedure(s) Performed: Procedure(s) (LRB): CYSTOSCOPY WITH URETHRAL DILATATION, REMOVAL OF FOREIGN BODY (N/A)  Patient Location: PACU  Anesthesia Type: General  Level of Consciousness: awake, oriented, sedated and patient cooperative  Airway & Oxygen Therapy: Patient Spontanous Breathing and Patient connected to face mask oxygen  Post-op Assessment: Report given to PACU RN and Post -op Vital signs reviewed and stable  Post vital signs: Reviewed and stable  Complications: No apparent anesthesia complications  Last Vitals:  Vitals Value Taken Time  BP    Temp 36.4 C 12/04/19 1430  Pulse 70 12/04/19 1431  Resp 11 12/04/19 1431  SpO2 100 % 12/04/19 1431  Vitals shown include unvalidated device data.  Last Pain:  Vitals:   12/04/19 1231  TempSrc: Oral  PainSc: 4       Patients Stated Pain Goal: 5 (12/04/19 1231)

## 2019-12-04 NOTE — Interval H&P Note (Signed)
History and Physical Interval Note:  12/04/2019 1:39 PM  Douglas Lewis  has presented today for surgery, with the diagnosis of membranous urthral stricture.  The various methods of treatment have been discussed with the patient and family. After consideration of risks, benefits and other options for treatment, the patient has consented to  Procedure(s): CYSTOSCOPY WITH URETHRAL DILATATION (N/A) as a surgical intervention.  The patient's history has been reviewed, patient examined, no change in status, stable for surgery.  I have reviewed the patient's chart and labs.  Questions were answered to the patient's satisfaction.     Irine Seal

## 2020-06-24 IMAGING — DX DG KNEE 1-2V*L*
2 series · 2 of 2 positions shown · non-contrast
Comparison: Contralateral knee.

CLINICAL DATA: Chronic bilateral knee pain.

EXAM:
LEFT KNEE - 1-2 VIEW

[dg knee 1-2 views left (1 of 2)]
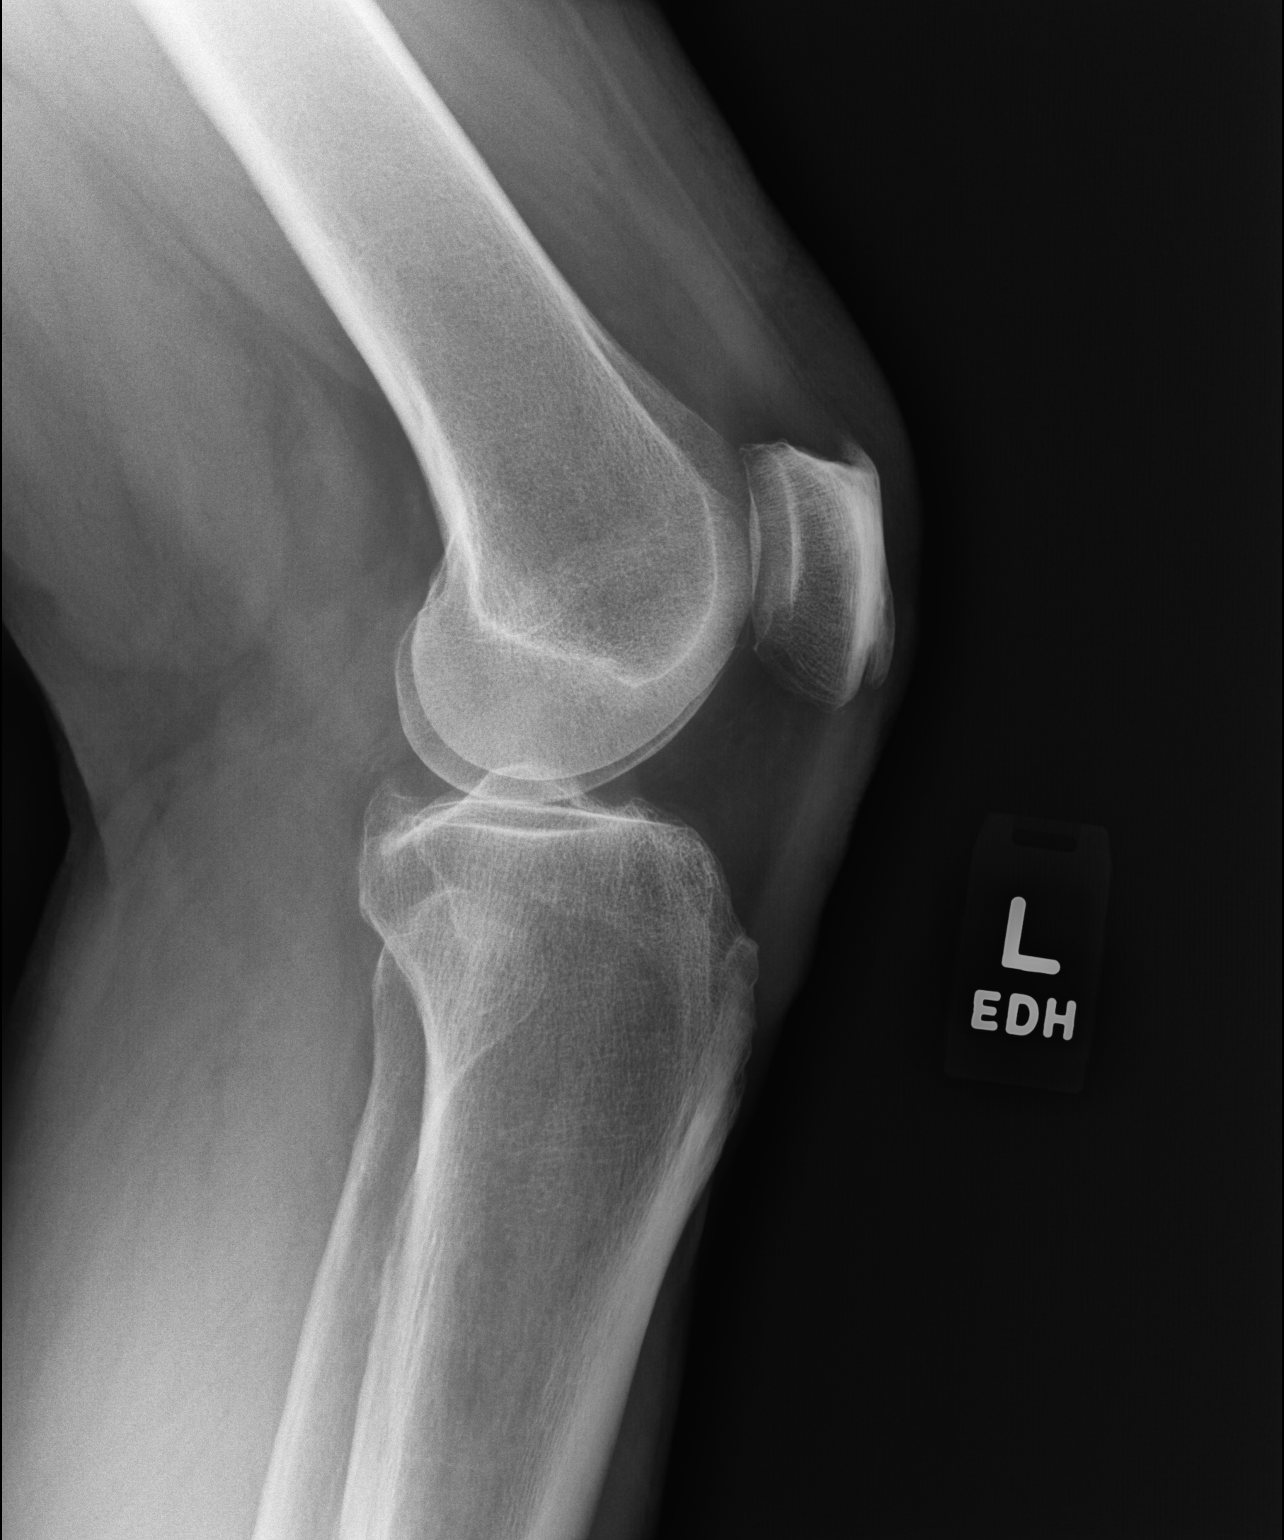

[dg knee 1-2 views left (2 of 2)]
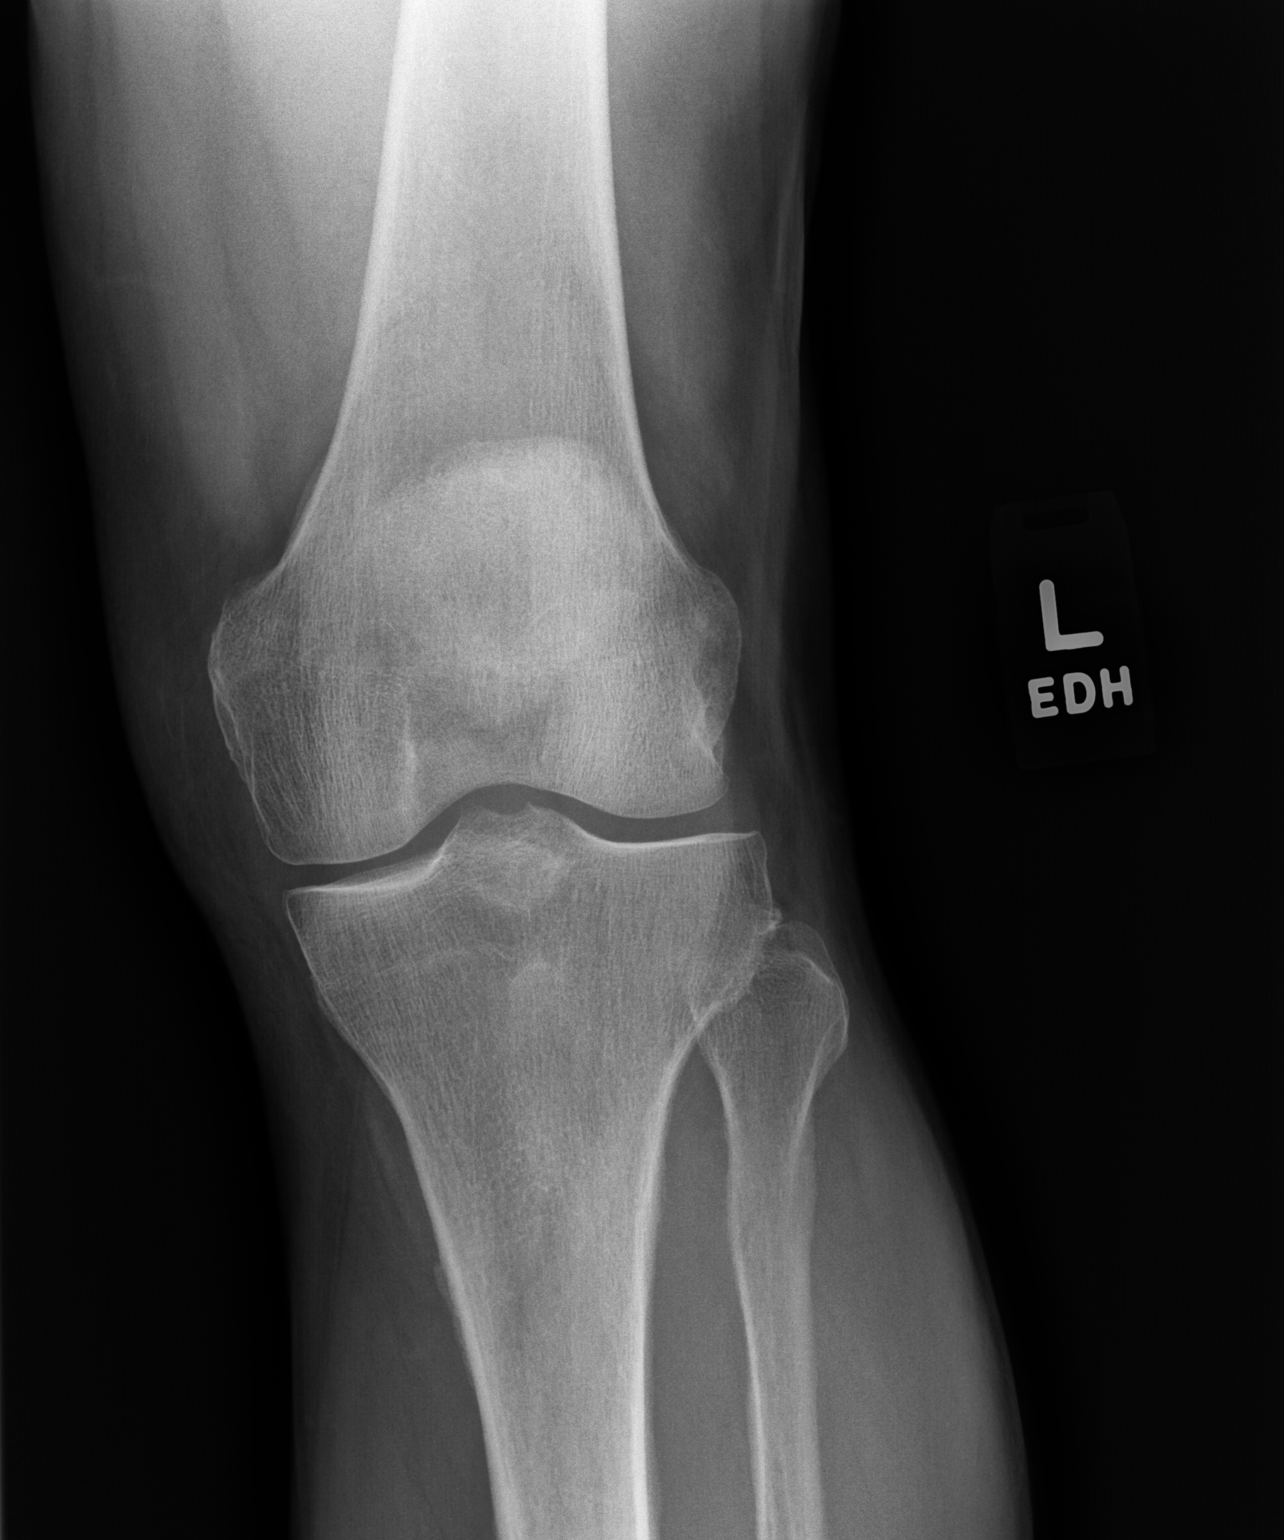

[2 of 2 positions shown; findings below may reference images not displayed]

FINDINGS: Mild patellofemoral osteoarthritic changes also with mild joint
space narrowing but without significant osteophytes in the medial
and lateral compartments. No signs of acute fracture or effusion.
IMPRESSION: Mild degenerative changes in the patellofemoral compartment.

## 2021-04-25 ENCOUNTER — Ambulatory Visit: Attending: Surgery

## 2021-04-25 ENCOUNTER — Ambulatory Visit: Admit: 2021-04-25 | Discharge: 2021-04-25 | Payer: MEDICARE | Attending: Surgery

## 2021-04-25 DIAGNOSIS — D171 Benign lipomatous neoplasm of skin and subcutaneous tissue of trunk: Secondary | ICD-10-CM

## 2021-04-25 NOTE — Progress Notes (Signed)
Progress  Notes by Philipp Ovens, MD at 04/25/21 1100                Author: Philipp Ovens, MD  Service: --  Author Type: Physician       Filed: 04/25/21 1719  Encounter Date: 04/25/2021  Status: Signed          Editor: Philipp Ovens, MD (Physician)               Ian Kelly is a 65 y.o.  male who is referred by [facility], [address], for further evaluation of an abdominal wall bulge an lipoma of the left upper back.       Ian Kelly tells me that has had an abdominal wall bulge in the midline above the umbilicus for over one year. The bulge has become  somewhat larger. Minimal abdominal discomfort. Associated nausea and vomiting. No coffee ground emesis or hematemesis. Occasional use of Aleve.  No previous abdominal surgery. Furthermore, Ian Kelly has had a subcutaneous mass on his left upper back for  some time now. The mass has become progressively larger and more bothersome to him. No associated drainage or bleeding. Found to have lipoma.    He has otherwise been in his usual state of health.         Past Medical History:        Diagnosis  Date         ?  Diastasis of rectus abdominis  04/25/2021     ?  History of cancer           ?  Lipoma of torso  04/25/2021        History reviewed. No pertinent surgical history.      History reviewed. No pertinent family history.        Social History          Socioeconomic History         ?  Marital status:  MARRIED       Tobacco Use         ?  Smoking status:  Never Smoker     ?  Smokeless tobacco:  Never Used       Substance and Sexual Activity         ?  Alcohol use:  Not Currently         ?  Drug use:  Never        Review of systems negative except as noted.      Review of Systems    Constitutional: Negative for chills and fever.    Gastrointestinal: Positive for nausea and vomiting . Negative for abdominal pain.    Musculoskeletal:         Discomfort at site of lipoma.        Physical Exam   Vitals reviewed.    Constitutional:        General: He is not in acute  distress.     Appearance: Normal appearance. He is normal weight.    HENT:       Head: Normocephalic and atraumatic.   Eyes :       General: No scleral icterus.  Cardiovascular:       Rate and Rhythm: Normal rate and regular rhythm.   Pulmonary :       Effort: Pulmonary effort is normal.      Breath sounds: Normal breath sounds.   Abdominal :      General: There is no distension.  Palpations: Abdomen is soft.      Tenderness: There is no abdominal tenderness. There is no guarding or rebound.      Hernia: No hernia is present. There is no hernia in the umbilical area or  ventral area.      Comments: Diastasis of rectus muscles.     Musculoskeletal:          General: Normal range of motion.      Cervical back: Neck supple.     Lymphadenopathy:       Cervical: No cervical adenopathy.   Skin :               Comments: On the left upper back, there is an approximately 6 cm x 4 cm, well circumscribed, freely movable subcutaneous mass.  Clinically, this is c/w a lipoma.    Neurological:       General: No focal deficit present.      Mental Status: He is alert.         ASSESSMENT and PLAN   Ian Kelly is a 65 yo man with diastasis of the rectus muscles and a lipoma on his left upper back. In view of the findings on H and P, he should benefit from excision of the lipoma as it is bothersome  to him. Discussed procedure with Ian Kelly including risks of bleeding, infection, need for further surgery, recurrence. He understands and wishes  to proceed. I have tentatively scheduled Ian Kelly for surgery on May 04, 2021 at [facility] and will see  him back in the office postoperatively. Explained to Ian Kelly that he has a diastasis of the rectus muscles and not a ventral hernia and that there is no indication for surgical intervention at this  time. In terms of his nausea and vomiting, will check a barium swallow as Ian Kelly may have a hiatal hernia.         CC: [facility], [facility]

## 2021-04-25 NOTE — Progress Notes (Signed)
1. Have you been to the ER, urgent care clinic since your last visit?  Hospitalized since your last visit?No    2. Have you seen or consulted any other health care providers outside of the [facility] since your last visit?  Include any pap smears or colon screening. No

## 2021-04-27 NOTE — Interval H&P Note (Signed)
PHONE INTERVIEW CONDUCTED. TWO IDENTIFIERS VERIFIED. PATIENT NOT INTEREST IN USING CHG SOAP, ADVISED OFTHE BENEFITS OF USING SOAP .    [facility]  PREOPERATIVE INSTRUCTIONS    Surgery Date:   05-04-2021    Your surgeon's office or [facility] staff will call you between 4 PM- 8 PM the day before surgery with your arrival time. If your surgery is on a Monday, you will receive a call the preceding Friday.    1. Please report to [facility] Patient Access/Admitting on the 1st floor.  Bring your insurance card, photo identification, and any copayment ( if applicable).   2. If you are going home the same day of your surgery, you must have a responsible adult to drive you home. You need to have a responsible adult to stay with you the first 24 hours after surgery and you should not drive a car for 24 hours following your surgery.  3. Do NOT eat any solid foods after midnight the night before surgery including candy, mint or gum. You may drink clear liquids from midnight until 1 hour prior to your arrival. You may drink up to 12 ounces at one time every 4 hours.  Please note special instructions, if applicable, below for medications.  4. Do NOT drink alcohol or smoke 24 hours before surgery. STOP smoking for 14 days prior as it helps with breathing and healing after surgery.  5. If you are being admitted to the hospital, please leave personal belongings/luggage in your car until you have an assigned hospital room number.  6. Please wear comfortable clothes. Wear your glasses instead of contacts. We ask that all money, jewelry and valuables be left at home. Wear no make up, particularly mascara, the day of surgery.   7.  All body piercings, rings, and jewelry need to be removed and left at home. Please remove any nail polish or artifical nails from your fingernails. Please wear your hair loose or down. Please no pony-tails, buns, or any metal hair accessories. If you shower the morning of surgery, please do not apply  any lotions or powders afterwards. You may wear deodorant, unless having breast surgery.  Do not shave any body area within 24 hours of your surgery.  8. Please follow all instructions to avoid any potential surgical cancellation.  9. Should your physical condition change, (i.e. fever, cold, flu, etc.) please notify your surgeon as soon as possible.  10. It is important to be on time. If a situation occurs where you may be delayed, please call:  [phone] / [phone] on the day of surgery.  11. The Preadmission Testing staff can be reached at [phone].  12. Special instructions: none      No current facility-administered medications for this encounter.     No current outpatient medications on file.        1. YOU MUST ONLY TAKE THESE MEDICATIONS THE MORNING OF SURGERY WITH A SIP OF WATER: none  2. MEDICATIONS TO TAKE THE MORNING OF SURGERY ONLY IF NEEDED: none  3. HOLD these prescription medications BEFORE Surgery: none  4. Ask your surgeon/prescribing physician about when/if to STOP taking these medications: none  5. Stop all vitamins, herbal medicines and Aspirin containing products 7 days prior to surgery. Stop any non-steroidal anti-inflammatory drugs (i.e. Ibuprofen, Naproxen, Advil, Aleve) 3 days before surgery. You may take Tylenol.    6. If you are currently taking Plavix, Coumadin,or any other blood-thinning/anticoagulant medication contact your prescribing physician  for instructions.    Eating and Drinking Before Surgery    . You may eat a regular dinner at the usual time on the day before your surgery.  . Do NOT eat any solid foods after midnight unless your arrival time at the hospital is 3pm or later.  . You may drink clear liquids only from 12 midnight until 1 hours prior to your arrival time at the hospital on the day of your surgery. Do NOT drink alcohol.  . Clear liquids include:  o Water  o Fruit juices without pulp( i.e. apple juice)  o Carbonated beverages  o Black coffee (no  cream/milk)  o Tea (no cream/milk)  o Gatorade  . You may drink up to 12-16 ounces at one time every 4 hours between the hours of midnight and 1 hour before your arrival time at the hospital. Example- if your arrival time at the hospital is 6am, you may drink 12-16 ounces of clear liquids no later than 5am.  . If your arrival time at the hospital is 3pm or later, you may eat a light breakfast before 8am.  . A light breakfast includes:  o Toast or bagel (no butter)  o Black coffee (no cream/milk)  o Tea (no cream/milk)  o Fruit juices without pulp ( i.e. apple juice)  o Do NOT eat meat, eggs, vegetables or fruit  . If you have any questions, please contact your surgeon's office.      Preventing Infections Before and After - Your Surgery    IMPORTANT INSTRUCTIONS    You play an important role in your health and preparation for surgery. To reduce the germs on your skin you will need to shower with CHG soap (Chorhexidine gluconate 4%) two times before surgery.    CHG soap (Hibiclens, Hex-A-Clens or store brand)  . CHG soap will be provided at your Preadmission Testing (PAT) appointment.  . If you do not have a PAT appointment before surgery, you may arrange to pick up CHG soap from our office or purchase CHG soap at a pharmacy, grocery or department store.  . You need to purchase TWO 4 ounce bottles to use for your 2 showers.    Steps to follow:  1. Wash your hair with your normal shampoo and your body with regular soap and rinse well to remove shampoo and soap from your skin.  2. Wet a clean washcloth and turn off the shower.  3. Put CHG soap on washcloth and apply to your entire body from the neck down. Do not use on your head, face or private parts(genitals). Do not use CHG soap on open sores, wounds or areas of skin irritation.  4. Wash you body gently for 5 minutes. Do not wash your skin too hard. This soap does not create lather. Pay special attention to your underarms and from your belly button to your  feet.  5. Turn the shower back on and rinse well to get CHG soap off your body.  6. Pat your skin dry with a clean, dry towel. Do not apply lotions or moisturizer.  7. Put on clean clothes and sleep on fresh bed sheets and do not allow pets to sleep with you.    Shower with CHG soap 2 times before your surgery  . The evening before your surgery  . The morning of your surgery      Tips to help prevent infections after your surgery:  1. Protect your surgical wound from germs:  ?  Hand washing is the most important thing you and your caregivers can do to prevent infections.  ? Keep your bandage clean and dry!  ? Do not touch your surgical wound.  2. Use clean, freshly washed towels and washcloths every time you shower; do not share bath linens with others.  3. Until your surgical wound is healed, wear clothing and sleep on bed linens each day that are clean and freshly washed.  4. Do not allow pets to sleep in your bed with you or touch your surgical wound.  5. Do not smoke - smoking delays wound healing. This may be a good time to stop smoking.  6. If you have diabetes, it is important for you to manage your blood sugar levels properly before your surgery as well as after your surgery. Poorly managed blood sugar levels slow down wound healing and prevent you from healing completely.              Patient Information Regarding COVID Restrictions      Day of Procedure    . Please park in the parking deck or any designated visitor parking lot.  Marland Kitchen Enter the facility through the Main Entrance of the hospital.  . On the day of surgery, please provide the cell phone number of the person who will be waiting for you to the Patient Access representative at the time of registration.  . Please wear a mask on the day of your procedure.  . We are now allowing two designated visitors per stay. Pediatric patients may have 2 designated visitors. These two people may come in with you on the day of your procedure.  . The designated  visitor must also wear a mask.  . Once your procedure and the immediate recovery period is completed, a nurse in the recovery area will contact your designated visitor to inform them of your room number or to otherwise review other pertinent information regarding your care.    . Social distancing practices are to be adhered to in waiting areas and the cafeteria.       The patient was contacted  via phone.   He verbalized understanding of all instructions does not  need reinforcement.

## 2021-05-04 ENCOUNTER — Inpatient Hospital Stay: Payer: MEDICARE

## 2021-05-04 MED ORDER — ONDANSETRON (PF) 4 MG/2 ML INJECTION
4 mg/2 mL | INTRAMUSCULAR | Status: DC | PRN
Start: 2021-05-04 — End: 2021-05-04
  Administered 2021-05-04: 17:00:00 via INTRAVENOUS

## 2021-05-04 MED ORDER — ROCURONIUM 10 MG/ML IV
10 mg/mL | INTRAVENOUS | Status: DC | PRN
Start: 2021-05-04 — End: 2021-05-04
  Administered 2021-05-04: 17:00:00 via INTRAVENOUS

## 2021-05-04 MED ORDER — WATER FOR INJECTION, STERILE INJECTION
1 gram | INTRAMUSCULAR | Status: AC
Start: 2021-05-04 — End: 2021-05-04
  Administered 2021-05-04: 17:00:00 via INTRAVENOUS

## 2021-05-04 MED ORDER — SODIUM CHLORIDE 0.9 % IJ SYRG
INTRAMUSCULAR | Status: DC | PRN
Start: 2021-05-04 — End: 2021-05-04

## 2021-05-04 MED ORDER — OXYCODONE 5 MG TAB
5 mg | ORAL_TABLET | ORAL | 0 refills | Status: AC | PRN
Start: 2021-05-04 — End: 2021-05-07

## 2021-05-04 MED ORDER — ACETAMINOPHEN 500 MG TAB
500 mg | ORAL_TABLET | Freq: Four times a day (QID) | ORAL | 2 refills | Status: DC | PRN
Start: 2021-05-04 — End: 2021-05-23

## 2021-05-04 MED ORDER — ROCURONIUM 10 MG/ML IV
10 mg/mL | INTRAVENOUS | Status: AC
Start: 2021-05-04 — End: ?

## 2021-05-04 MED ORDER — BUPIVACAINE (PF) 0.25 % (2.5 MG/ML) IJ SOLN
0.25 % (2.5 mg/mL) | INTRAMUSCULAR | Status: AC
Start: 2021-05-04 — End: ?

## 2021-05-04 MED ORDER — DEXAMETHASONE SODIUM PHOSPHATE 4 MG/ML IJ SOLN
4 mg/mL | INTRAMUSCULAR | Status: AC
Start: 2021-05-04 — End: ?

## 2021-05-04 MED ORDER — HYDROCODONE-ACETAMINOPHEN 5 MG-325 MG TAB
5-325 mg | ORAL | Status: DC | PRN
Start: 2021-05-04 — End: 2021-05-04

## 2021-05-04 MED ORDER — FENTANYL CITRATE (PF) 50 MCG/ML IJ SOLN
50 mcg/mL | INTRAMUSCULAR | Status: AC
Start: 2021-05-04 — End: ?

## 2021-05-04 MED ORDER — LACTATED RINGERS IV
INTRAVENOUS | Status: DC | PRN
Start: 2021-05-04 — End: 2021-05-04
  Administered 2021-05-04: 17:00:00 via INTRAVENOUS

## 2021-05-04 MED ORDER — MIDAZOLAM 1 MG/ML IJ SOLN
1 mg/mL | INTRAMUSCULAR | Status: DC | PRN
Start: 2021-05-04 — End: 2021-05-04
  Administered 2021-05-04: 17:00:00 via INTRAVENOUS

## 2021-05-04 MED ORDER — DEXAMETHASONE SODIUM PHOSPHATE 4 MG/ML IJ SOLN
4 mg/mL | INTRAMUSCULAR | Status: DC | PRN
Start: 2021-05-04 — End: 2021-05-04
  Administered 2021-05-04: 17:00:00 via INTRAVENOUS

## 2021-05-04 MED ORDER — PROPOFOL 10 MG/ML IV EMUL
10 mg/mL | INTRAVENOUS | Status: DC | PRN
Start: 2021-05-04 — End: 2021-05-04
  Administered 2021-05-04: 17:00:00 via INTRAVENOUS

## 2021-05-04 MED ORDER — ACETAMINOPHEN 500 MG TAB
500 mg | Freq: Once | ORAL | Status: AC
Start: 2021-05-04 — End: 2021-05-04
  Administered 2021-05-04: 15:00:00 via ORAL

## 2021-05-04 MED ORDER — ONDANSETRON (PF) 4 MG/2 ML INJECTION
4 mg/2 mL | INTRAMUSCULAR | Status: AC
Start: 2021-05-04 — End: ?

## 2021-05-04 MED ORDER — SODIUM CHLORIDE 0.9 % IJ SYRG
Freq: Three times a day (TID) | INTRAMUSCULAR | Status: DC
Start: 2021-05-04 — End: 2021-05-04

## 2021-05-04 MED ORDER — MIDAZOLAM 1 MG/ML IJ SOLN
1 mg/mL | INTRAMUSCULAR | Status: AC
Start: 2021-05-04 — End: ?

## 2021-05-04 MED ORDER — GLYCOPYRROLATE(PF) 0.4 MG/2 ML (0.2 MG/ML) IN STERILE WATER IV SYRINGE
0.4 mg/2 mL (0.2 mg/mL) | INTRAVENOUS | Status: AC
Start: 2021-05-04 — End: ?

## 2021-05-04 MED ORDER — EPHEDRINE SULFATE 50 MG/ML INTRAVENOUS SOLUTION
50 mg/mL | INTRAVENOUS | Status: AC
Start: 2021-05-04 — End: ?

## 2021-05-04 MED ORDER — HYDROMORPHONE 1 MG/ML INJECTION SOLUTION
1 mg/mL | INTRAMUSCULAR | Status: DC | PRN
Start: 2021-05-04 — End: 2021-05-04

## 2021-05-04 MED ORDER — FENTANYL CITRATE (PF) 50 MCG/ML IJ SOLN
50 mcg/mL | INTRAMUSCULAR | Status: DC | PRN
Start: 2021-05-04 — End: 2021-05-04
  Administered 2021-05-04 (×2): via INTRAVENOUS

## 2021-05-04 MED ORDER — EPHEDRINE (PF) 50 MG/5 ML (10 MG/ML) IN NS IV SYRINGE
50 mg/5 mL (10 mg/mL) | INTRAVENOUS | Status: DC | PRN
Start: 2021-05-04 — End: 2021-05-04
  Administered 2021-05-04 (×2): via INTRAVENOUS

## 2021-05-04 MED ORDER — SODIUM CHLORIDE 0.9 % IV
INTRAVENOUS | Status: DC | PRN
Start: 2021-05-04 — End: 2021-05-04
  Administered 2021-05-04: 18:00:00 via INTRAVENOUS

## 2021-05-04 MED ORDER — SUCCINYLCHOLINE CHLORIDE 20 MG/ML INJECTION
20 mg/mL | INTRAMUSCULAR | Status: DC | PRN
Start: 2021-05-04 — End: 2021-05-04
  Administered 2021-05-04: 17:00:00 via INTRAVENOUS

## 2021-05-04 MED ORDER — ONDANSETRON (PF) 4 MG/2 ML INJECTION
4 mg/2 mL | INTRAMUSCULAR | Status: DC | PRN
Start: 2021-05-04 — End: 2021-05-04

## 2021-05-04 MED ORDER — LIDOCAINE (PF) 20 MG/ML (2 %) IJ SOLN
20 mg/mL (2 %) | INTRAMUSCULAR | Status: AC
Start: 2021-05-04 — End: ?

## 2021-05-04 MED ORDER — PHENYLEPHRINE IN 0.9 % SODIUM CL (40 MCG/ML) IV SYRINGE
0.4 mg/10 mL (40 mcg/mL) | INTRAVENOUS | Status: DC | PRN
Start: 2021-05-04 — End: 2021-05-04
  Administered 2021-05-04 (×7): via INTRAVENOUS

## 2021-05-04 MED ORDER — BUPIVACAINE (PF) 0.25 % (2.5 MG/ML) IJ SOLN
0.25 % (2.5 mg/mL) | Freq: Once | INTRAMUSCULAR | Status: AC
Start: 2021-05-04 — End: 2021-05-04
  Administered 2021-05-04 (×3)

## 2021-05-04 MED ORDER — LIDOCAINE (PF) 20 MG/ML (2 %) IJ SOLN
20 mg/mL (2 %) | INTRAMUSCULAR | Status: DC | PRN
Start: 2021-05-04 — End: 2021-05-04
  Administered 2021-05-04: 17:00:00 via INTRAVENOUS

## 2021-05-04 MED ORDER — PROPOFOL 10 MG/ML IV EMUL
10 mg/mL | INTRAVENOUS | Status: AC
Start: 2021-05-04 — End: ?

## 2021-05-04 MED FILL — NORMAL SALINE FLUSH 0.9 % INJECTION SYRINGE: INTRAMUSCULAR | Qty: 40

## 2021-05-04 MED FILL — LIDOCAINE (PF) 20 MG/ML (2 %) IJ SOLN: 20 mg/mL (2 %) | INTRAMUSCULAR | Qty: 5

## 2021-05-04 MED FILL — SENSORCAINE-MPF 0.25 % (2.5 MG/ML) INJECTION SOLUTION: 0.25 % (2.5 mg/mL) | INTRAMUSCULAR | Qty: 30

## 2021-05-04 MED FILL — MIDAZOLAM 1 MG/ML IJ SOLN: 1 mg/mL | INTRAMUSCULAR | Qty: 4

## 2021-05-04 MED FILL — CEFAZOLIN 1 GRAM SOLUTION FOR INJECTION: 1 gram | INTRAMUSCULAR | Qty: 2000

## 2021-05-04 MED FILL — ROCURONIUM 10 MG/ML IV: 10 mg/mL | INTRAVENOUS | Qty: 5

## 2021-05-04 MED FILL — GLYCOPYRROLATE(PF) 0.4 MG/2 ML (0.2 MG/ML) IN STERILE WATER IV SYRINGE: 0.4 mg/2 mL (0.2 mg/mL) | INTRAVENOUS | Qty: 2

## 2021-05-04 MED FILL — FENTANYL CITRATE (PF) 50 MCG/ML IJ SOLN: 50 mcg/mL | INTRAMUSCULAR | Qty: 2

## 2021-05-04 MED FILL — EPHEDRINE SULFATE 50 MG/ML INTRAVENOUS SOLUTION: 50 mg/mL | INTRAVENOUS | Qty: 1

## 2021-05-04 MED FILL — ACETAMINOPHEN 500 MG TAB: 500 mg | ORAL | Qty: 2

## 2021-05-04 MED FILL — DIPRIVAN 10 MG/ML INTRAVENOUS EMULSION: 10 mg/mL | INTRAVENOUS | Qty: 20

## 2021-05-04 MED FILL — ONDANSETRON (PF) 4 MG/2 ML INJECTION: 4 mg/2 mL | INTRAMUSCULAR | Qty: 2

## 2021-05-04 MED FILL — DEXAMETHASONE SODIUM PHOSPHATE 4 MG/ML IJ SOLN: 4 mg/mL | INTRAMUSCULAR | Qty: 1

## 2021-05-04 NOTE — Anesthesia Post-Procedure Evaluation (Signed)
Procedure(s):  EXCISE LIPOMA LEFT UPPER BACK.    general    Anesthesia Post Evaluation      Multimodal analgesia: multimodal analgesia not used between 6 hours prior to anesthesia start to PACU discharge  Patient location during evaluation: PACU  Patient participation: complete - patient participated  Level of consciousness: awake and responsive to verbal stimuli  Pain score: 0  Pain management: adequate  Airway patency: patent  Anesthetic complications: no  Cardiovascular status: stable and acceptable  Respiratory status: acceptable  Hydration status: acceptable  Post anesthesia nausea and vomiting:  none  Final Post Anesthesia Temperature Assessment:  Normothermia (36.0-37.5 degrees C)      INITIAL Post-op Vital signs:   Vitals Value Taken Time   BP 141/95 05/04/21 1431   Temp 36.3 C (97.4 F) 05/04/21 1355   Pulse 87 05/04/21 1436   Resp 29 05/04/21 1436   SpO2 90 % 05/04/21 1436   Vitals shown include unvalidated device data.

## 2021-05-04 NOTE — Interval H&P Note (Signed)
Discharge instructions given to family member Teacher, English as a foreign language) by primary RN.  Written and verbal  instructions given to patient using teach back method.  Symptoms, side effects and signs discussed.  Patient discharging home via private vehicle with friend.

## 2021-05-04 NOTE — H&P (Signed)
Date of Surgery Update:  Ian Kelly was seen and examined.  History and physical has been reviewed. The patient has been examined. There have been no significant clinical changes since the completion of the originally dated History and Physical.  Patient identified by surgeon; surgical site was confirmed by patient and surgeon.    Signed By: Philipp Ovens, MD     May 04, 2021 10:31 AM         Please note from the office and include the additional information below:    Past Medical History  Past Medical History:   Diagnosis Date   . Diastasis of rectus abdominis 04/25/2021   . History of cancer    . Lipoma of torso 04/25/2021        Past Surgical History  Past Surgical History:   Procedure Laterality Date   . HX ACL RECONSTRUCTION Right    . HX ROTATOR CUFF REPAIR Bilateral    . HX ROTATOR CUFF REPAIR Bilateral    . HX ROTATOR CUFF REPAIR Bilateral         Social History  The patient Ian Kelly  reports that he has never smoked. He has never used smokeless tobacco. He reports previous alcohol use. He reports current drug use. Drug: Marijuana.     Family History  History reviewed. No pertinent family history.

## 2021-05-04 NOTE — Op Note (Signed)
Brief Postoperative Note    Patient: FRANZ ASELTINE  Date of Birth: Jan 27, 1956  MRN: [id number]    Date of Procedure: 05/04/2021     Pre-Op Diagnosis:  Lipoma Left Upper Back.    Post-Op Diagnosis:  Same.     Procedure(s):   Excise Lipoma Left Upper Back.    Surgeon(s):  Philipp Ovens, MD    Surgical Assistant:  Jeanell Sparrow, SA.    Anesthesia: General     Estimated Blood Loss (mL): Approximately 5 ml.    Complications: None    Specimens:   ID Type Source Tests Collected by Time Destination   1 : Lipoma Left Upper Back  Fresh Back  Philipp Ovens, MD 05/04/2021 1314 Pathology        Implants: * No implants in log *    Drains: * No LDAs found *    Findings: Lipoma. Subcutaneous. Approximately 6 cm x 5 cm.    Electronically Signed by Philipp Ovens, MD on 05/04/2021 at 1:28 PM

## 2021-05-04 NOTE — Anesthesia Pre-Procedure Evaluation (Signed)
Relevant Problems   No relevant active problems       Anesthetic History   No history of anesthetic complications            Review of Systems / Medical History  Patient summary reviewed, nursing notes reviewed and pertinent labs reviewed    Pulmonary  Within defined limits                 Neuro/Psych   Within defined limits           Cardiovascular  Within defined limits                Exercise tolerance: >4 METS  Comments: Pt has hx of some skin ca lesions  - pt has DJD  - as a child he had a tumor removed   GI/Hepatic/Renal  Within defined limits              Endo/Other  Within defined limits           Other Findings              Physical Exam    Airway  Mallampati: II  TM Distance: 4 - 6 cm         Cardiovascular    Rhythm: regular  Rate: normal         Dental    Dentition: Edentulous  Comments: Pt has some teeth on bottom, but no teeth at top   Pulmonary  Breath sounds clear to auscultation               Abdominal         Other Findings            Anesthetic Plan    ASA: 2  Anesthesia type: general          Induction: Intravenous  Anesthetic plan and risks discussed with: Patient

## 2021-05-04 NOTE — Op Note (Signed)
[facility] [facility]  OPERATIVE REPORT    Name:  Ian Kelly, Ian Kelly  MR#:  [id number]  DOB:  1956/07/28  ACCOUNT #:  [id number]  DATE OF SERVICE:  05/04/2021    PREOPERATIVE DIAGNOSIS:  Lipoma, left upper back.    POSTOPERATIVE DIAGNOSIS:  Lipoma, left upper back.    PROCEDURE PERFORMED:  Excise lipoma, left upper back.    SURGEON:  Radene Ou, MD    ASSISTANT:  Jeanell Sparrow, SA    ANESTHESIA:  General endotracheal.    COMPLICATIONS:  None.    SPECIMENS REMOVED:  Lipoma of the left upper back to Pathology.    IMPLANTS:  None.    ESTIMATED BLOOD LOSS:  Approximately 5 mL.    FLUIDS:  Crystalloid 1000 mL.    DRAINS:  None.    INDICATION FOR SURGERY:  The patient is a 65 year old man with a well-circumscribed freely movable subcutaneous mass on his left upper back.  Clinically, this is consistent with a lipoma. [name] is brought to the operating room at this time for excision of the lipoma as it is bothersome to him.  The risks of the procedure, including but not limited to, infection, bleeding, the need for further surgery and recurrence were discussed in detail with the patient.  [name] understood and wished to proceed.    PROCEDURE:  After consent was obtained, the patient was brought to the operating room where he was placed in the supine position on the operating room table.  Following the induction of an adequate level of general anesthesia via the endotracheal tube, compression devices were placed on both lower extremities.  The patient was then placed on his right side and the beanbag was inflated.  After ensuring that all pressure points were well padded, the left upper back was prepped with ChloraPrep and draped as a sterile field.  Local anesthetic was infiltrated, and an incision over the lipoma was opened sharply.  Subcutaneous bleeders were carefully cauterized.  The incision was carried down to the lipoma which was readily identified, dissected free circumferentially and excised.  The  specimen, which measured approximately 6 cm x 5 cm, was passed off the field and submitted for histopathologic evaluation.  It should be noted that the lipoma was subcutaneous.  There was no extension beneath the fascia.  The wound was inspected, and there did not appear to be residual lipoma.  Several bleeders were carefully cauterized.  The wound was irrigated with saline, inspected and found to be hemostatic.  Following this, the surgical incision was closed with two layers of running 2-0 Vicryl suture followed by 4-0 Monocryl subcuticular suture to the skin.  Additional local anesthetic was infiltrated, and the incision was dressed with Dermabond.  The beanbag was deflated, and the patient was returned to the supine position on the operating room table.  He was awakened from his general anesthetic and extubated in the operating room.  The patient was transferred to the stretcher and brought to the recovery room in stable condition, having tolerated the procedure well.  At the conclusion of the procedure, all sponge counts, instrument counts and needle counts were reported as correct x2.      Philipp Ovens, MD      [id number]  D:  05/04/2021 13:33  T:  05/04/2021 19:00  JOB #:  [id number]  CC:  Philipp Ovens, MD

## 2021-05-16 ENCOUNTER — Ambulatory Visit: Payer: MEDICARE | Attending: Family

## 2021-05-17 ENCOUNTER — Ambulatory Visit: Payer: MEDICARE | Attending: Adult Health

## 2021-05-17 NOTE — Telephone Encounter (Signed)
Attempted to call patient about his missed post-op visit with Janora Norlander this morning. We had to leave a voicemail and a no show letter was sent out.

## 2021-05-23 ENCOUNTER — Ambulatory Visit: Attending: Adult Health

## 2021-05-23 ENCOUNTER — Ambulatory Visit: Admit: 2021-05-23 | Discharge: 2021-05-23 | Payer: MEDICARE | Attending: Adult Health

## 2021-05-23 DIAGNOSIS — Z09 Encounter for follow-up examination after completed treatment for conditions other than malignant neoplasm: Secondary | ICD-10-CM

## 2021-05-23 NOTE — Progress Notes (Signed)
1. Have you been to the ER, urgent care clinic since your last visit?  Hospitalized since your last visit?No    2. Have you seen or consulted any other health care providers outside of the [facility] since your last visit?  Include any pap smears or colon screening. No

## 2021-05-23 NOTE — Progress Notes (Signed)
Chief Complaint   Patient presents with   . Surgical Follow-up     2 1/2 weeks s/p excise Lipoma left upper back     Ian Kelly 2+ week status post excision of a lipoma left upper back.  His only complaint is that he has to wear a mask in our office.  He was quite disgruntled at the front desk and was refusing to wear a mask.  He was put into an exam room.  Says the incision is a little bit itchy but otherwise no concerns.  He is already resumed his normal activity.  Denies drainage or pain in the area.  Complains of itchy skin" sores that will not heal".    Visit Vitals  BP 136/86   Pulse 61   Temp 98.1 F (36.7 C)   Resp 20   Ht 6' (1.829 m)   Wt 205 lb (93 kg)   SpO2 98%   BMI 27.80 kg/m     Somewhat anxious and picking at the scabs on his arms  Speech is clear breathing is unlabored  Left upper back incision is clean, dry and intact without erythema or induration and is healing well  Ambulating independently    ICD-10-CM ICD-9-CM    1. Follow-up exam  Z09 V67.9    2. Lipoma of torso  D17.1 214.1      Little more than 2-week status post excision of a lipoma from his left upper back  Area is healing as expected  Pathology was reviewed with patient  Advised he may bathe and shower as normal  He has no restrictions  He was discharged in surgical care with as needed follow-up

## 2021-12-07 ENCOUNTER — Emergency Department (HOSPITAL_COMMUNITY): Payer: 59

## 2021-12-07 ENCOUNTER — Encounter (HOSPITAL_COMMUNITY): Payer: Self-pay

## 2021-12-07 ENCOUNTER — Inpatient Hospital Stay (HOSPITAL_COMMUNITY)
Admission: EM | Admit: 2021-12-07 | Discharge: 2021-12-16 | DRG: 519 | Disposition: A | Discharge status: Skilled Nursing Facility | Payer: 59 | Attending: Neurosurgery | Admitting: Neurosurgery

## 2021-12-07 DIAGNOSIS — Z79899 Other long term (current) drug therapy: Secondary | ICD-10-CM

## 2021-12-07 DIAGNOSIS — M48061 Spinal stenosis, lumbar region without neurogenic claudication: Secondary | ICD-10-CM | POA: Diagnosis present

## 2021-12-07 DIAGNOSIS — G834 Cauda equina syndrome: Secondary | ICD-10-CM | POA: Diagnosis not present

## 2021-12-07 DIAGNOSIS — R739 Hyperglycemia, unspecified: Secondary | ICD-10-CM | POA: Diagnosis present

## 2021-12-07 DIAGNOSIS — M549 Dorsalgia, unspecified: Secondary | ICD-10-CM | POA: Diagnosis present

## 2021-12-07 DIAGNOSIS — K760 Fatty (change of) liver, not elsewhere classified: Secondary | ICD-10-CM | POA: Diagnosis present

## 2021-12-07 DIAGNOSIS — I700 Atherosclerosis of aorta: Secondary | ICD-10-CM | POA: Diagnosis present

## 2021-12-07 DIAGNOSIS — R339 Retention of urine, unspecified: Secondary | ICD-10-CM | POA: Diagnosis not present

## 2021-12-07 DIAGNOSIS — R918 Other nonspecific abnormal finding of lung field: Secondary | ICD-10-CM | POA: Diagnosis present

## 2021-12-07 DIAGNOSIS — Z9079 Acquired absence of other genital organ(s): Secondary | ICD-10-CM | POA: Diagnosis not present

## 2021-12-07 DIAGNOSIS — S32031A Stable burst fracture of third lumbar vertebra, initial encounter for closed fracture: Principal | ICD-10-CM | POA: Diagnosis present

## 2021-12-07 DIAGNOSIS — K5900 Constipation, unspecified: Secondary | ICD-10-CM | POA: Diagnosis present

## 2021-12-07 DIAGNOSIS — Z20822 Contact with and (suspected) exposure to covid-19: Secondary | ICD-10-CM | POA: Diagnosis not present

## 2021-12-07 DIAGNOSIS — Y92410 Unspecified street and highway as the place of occurrence of the external cause: Secondary | ICD-10-CM | POA: Diagnosis not present

## 2021-12-07 DIAGNOSIS — S32030A Wedge compression fracture of third lumbar vertebra, initial encounter for closed fracture: Secondary | ICD-10-CM | POA: Diagnosis present

## 2021-12-07 DIAGNOSIS — G8918 Other acute postprocedural pain: Secondary | ICD-10-CM | POA: Diagnosis not present

## 2021-12-07 DIAGNOSIS — Z419 Encounter for procedure for purposes other than remedying health state, unspecified: Secondary | ICD-10-CM

## 2021-12-07 LAB — CBC WITH DIFFERENTIAL/PLATELET
Abs Immature Granulocytes: 0.17 10*3/uL — ABNORMAL HIGH (ref 0.00–0.07)
Basophils Absolute: 0.1 10*3/uL (ref 0.0–0.1)
Basophils Relative: 1 %
Eosinophils Absolute: 0.1 10*3/uL (ref 0.0–0.5)
Eosinophils Relative: 1 %
HCT: 47.7 % (ref 39.0–52.0)
Hemoglobin: 16.1 g/dL (ref 13.0–17.0)
Immature Granulocytes: 1 %
Lymphocytes Relative: 18 %
Lymphs Abs: 2.2 10*3/uL (ref 0.7–4.0)
MCH: 29.5 pg (ref 26.0–34.0)
MCHC: 33.8 g/dL (ref 30.0–36.0)
MCV: 87.4 fL (ref 80.0–100.0)
Monocytes Absolute: 0.6 10*3/uL (ref 0.1–1.0)
Monocytes Relative: 5 %
Neutro Abs: 9.3 10*3/uL — ABNORMAL HIGH (ref 1.7–7.7)
Neutrophils Relative %: 74 %
Platelets: 162 10*3/uL (ref 150–400)
RBC: 5.46 MIL/uL (ref 4.22–5.81)
RDW: 14.6 % (ref 11.5–15.5)
WBC: 12.4 10*3/uL — ABNORMAL HIGH (ref 4.0–10.5)
nRBC: 0 % (ref 0.0–0.2)

## 2021-12-07 LAB — COMPREHENSIVE METABOLIC PANEL
ALT: 58 U/L — ABNORMAL HIGH (ref 0–44)
AST: 36 U/L (ref 15–41)
Albumin: 3.9 g/dL (ref 3.5–5.0)
Alkaline Phosphatase: 90 U/L (ref 38–126)
Anion gap: 7 (ref 5–15)
BUN: 14 mg/dL (ref 8–23)
CO2: 22 mmol/L (ref 22–32)
Calcium: 8.8 mg/dL — ABNORMAL LOW (ref 8.9–10.3)
Chloride: 108 mmol/L (ref 98–111)
Creatinine, Ser: 1.13 mg/dL (ref 0.61–1.24)
GFR, Estimated: >60 mL/min (ref >60)
Glucose, Bld: 123 mg/dL — ABNORMAL HIGH (ref 70–99)
Potassium: 4.1 mmol/L (ref 3.5–5.1)
Sodium: 137 mmol/L (ref 135–145)
Total Bilirubin: 0.6 mg/dL (ref 0.3–1.2)
Total Protein: 6.8 g/dL (ref 6.5–8.1)

## 2021-12-07 LAB — ETHANOL: Alcohol, Ethyl (B): <10 mg/dL (ref <10)

## 2021-12-07 LAB — CBG MONITORING, ED: Glucose-Capillary: 114 mg/dL — ABNORMAL HIGH (ref 70–99)

## 2021-12-07 LAB — HIV ANTIBODY (ROUTINE TESTING W REFLEX): HIV Screen 4th Generation wRfx: NONREACTIVE

## 2021-12-07 MED ORDER — ACETAMINOPHEN 500 MG PO TABS
1000.0000 mg | ORAL_TABLET | Freq: Four times a day (QID) | ORAL | Status: DC
  Administered 2021-12-07 – 2021-12-16 (×28): 1000 mg via ORAL
  Filled 2021-12-07 (×33): qty 2

## 2021-12-07 MED ORDER — HYDROMORPHONE HCL 1 MG/ML IJ SOLN
1.0000 mg | INTRAMUSCULAR | Status: DC | PRN
  Administered 2021-12-07 – 2021-12-10 (×12): 1 mg via INTRAVENOUS
  Filled 2021-12-07 (×14): qty 1

## 2021-12-07 MED ORDER — HYDROCODONE-ACETAMINOPHEN 5-325 MG PO TABS
1.0000 | ORAL_TABLET | ORAL | Status: DC | PRN
  Administered 2021-12-07: 1 via ORAL
  Filled 2021-12-07: qty 1

## 2021-12-07 MED ORDER — ENOXAPARIN SODIUM 40 MG/0.4ML IJ SOSY
40.0000 mg | PREFILLED_SYRINGE | INTRAMUSCULAR | Status: DC
  Administered 2021-12-08 – 2021-12-10 (×3): 40 mg via SUBCUTANEOUS
  Filled 2021-12-07 (×3): qty 0.4

## 2021-12-07 MED ORDER — ACETAMINOPHEN 650 MG RE SUPP: 650.0000 mg | Freq: Four times a day (QID) | RECTAL | Status: DC

## 2021-12-07 MED ORDER — SENNOSIDES-DOCUSATE SODIUM 8.6-50 MG PO TABS
1.0000 | ORAL_TABLET | Freq: Two times a day (BID) | ORAL | Status: DC
  Administered 2021-12-07 – 2021-12-16 (×18): 1 via ORAL
  Filled 2021-12-07 (×19): qty 1

## 2021-12-07 MED ORDER — POLYETHYLENE GLYCOL 3350 17 G PO PACK
17.0000 g | PACK | Freq: Every day | ORAL | Status: DC
  Administered 2021-12-07 – 2021-12-16 (×9): 17 g via ORAL
  Filled 2021-12-07 (×10): qty 1

## 2021-12-07 MED ORDER — ACETAMINOPHEN 325 MG PO TABS: 650.0000 mg | ORAL_TABLET | Freq: Four times a day (QID) | ORAL | Status: DC | PRN

## 2021-12-07 MED ORDER — HYDROMORPHONE HCL 1 MG/ML IJ SOLN
1.0000 mg | Freq: Once | INTRAMUSCULAR | Status: AC
  Administered 2021-12-07: 1 mg via INTRAVENOUS
  Filled 2021-12-07: qty 1

## 2021-12-07 MED ORDER — IOHEXOL 300 MG/ML  SOLN
100.0000 mL | Freq: Once | INTRAMUSCULAR | Status: AC | PRN
  Administered 2021-12-07: 100 mL via INTRAVENOUS

## 2021-12-07 MED ORDER — MORPHINE SULFATE (PF) 4 MG/ML IV SOLN
4.0000 mg | Freq: Once | INTRAVENOUS | Status: AC
  Administered 2021-12-07: 4 mg via INTRAVENOUS
  Filled 2021-12-07: qty 1

## 2021-12-07 MED ORDER — DIPHENHYDRAMINE HCL 50 MG/ML IJ SOLN: 12.5000 mg | Freq: Four times a day (QID) | INTRAMUSCULAR | Status: DC | PRN

## 2021-12-07 MED ORDER — ONDANSETRON HCL 4 MG/2ML IJ SOLN
4.0000 mg | Freq: Four times a day (QID) | INTRAMUSCULAR | Status: DC | PRN
  Administered 2021-12-07 – 2021-12-10 (×3): 4 mg via INTRAVENOUS
  Filled 2021-12-07 (×3): qty 2

## 2021-12-07 MED ORDER — HYDROMORPHONE 1 MG/ML IV SOLN: INTRAVENOUS | Status: DC

## 2021-12-07 MED ORDER — ACETAMINOPHEN 650 MG RE SUPP: 650.0000 mg | Freq: Four times a day (QID) | RECTAL | Status: DC | PRN

## 2021-12-07 MED ORDER — SODIUM CHLORIDE 0.9% FLUSH: 9.0000 mL | INTRAVENOUS | Status: DC | PRN

## 2021-12-07 MED ORDER — NALOXONE HCL 0.4 MG/ML IJ SOLN: 0.4000 mg | INTRAMUSCULAR | Status: DC | PRN

## 2021-12-07 MED ORDER — DIPHENHYDRAMINE HCL 12.5 MG/5ML PO ELIX: 12.5000 mg | ORAL_SOLUTION | Freq: Four times a day (QID) | ORAL | Status: DC | PRN

## 2021-12-07 NOTE — ED Notes (Signed)
SpO2 94% on 2L O2 Douglas Lewis

## 2021-12-07 NOTE — Progress Notes (Signed)
PT Cancellation Note  Patient Details Name: Maureen Duesing MRN: [id number] DOB: [birth year]   Cancelled Treatment:    Reason Eval/Treat Not Completed: Other (comment) (Pt awaiting TLSO.)   Alvira Philips 12/07/2021, 11:39 AM Alanzo Lamb M,PT Acute Rehab Services [phone] (pager)

## 2021-12-07 NOTE — ED Notes (Signed)
Patient placed on 2L O2 [place] for room air SpO2 86% after dilaudid administration.

## 2021-12-07 NOTE — Progress Notes (Signed)
Orthopedic Tech Progress Note Patient Details:  [name] 07-25-1956 [id number]  Patient ID: Douglas Lewis, male   DOB: [birth year], 66 y.o.   MRN: [id number] Pt is being admitted, but we only have small TLSO's in stock, so I placed a stat order with [facility] to get the correct size.  Vernona Rieger 12/07/2021, 5:13 AM
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
## 2021-12-07 NOTE — ED Notes (Signed)
Patient transported to CT 
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
## 2021-12-07 NOTE — Progress Notes (Deleted)
Orthopedic Tech Progress Note Patient Details:  Douglas Lewis 07/29/1956 [id number]  Patient ID: Douglas Lewis, male   DOB: December 24, 1955, 66 y.o.   MRN: [id number] Placed stat order with [facility] because we only have size small TLSO's in stock and based on pt's height/weight the size small would not fit around his waist.   Vernona Rieger 12/07/2021, 5:03 AM

## 2021-12-07 NOTE — ED Notes (Signed)
Ortho tech paged for TLSO brace. Awaiting call back.
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
## 2021-12-07 NOTE — ED Notes (Signed)
Pt refused TLSO brace

## 2021-12-07 NOTE — ED Notes (Signed)
Pt sheets changed

## 2021-12-07 DEATH — deceased

## 2021-12-08 ENCOUNTER — Other Ambulatory Visit: Payer: Self-pay | Admitting: Neurosurgery

## 2021-12-08 DIAGNOSIS — S32030A Wedge compression fracture of third lumbar vertebra, initial encounter for closed fracture: Secondary | ICD-10-CM | POA: Diagnosis not present

## 2021-12-08 LAB — BASIC METABOLIC PANEL
Anion gap: 11 (ref 5–15)
BUN: 10 mg/dL (ref 8–23)
CO2: 23 mmol/L (ref 22–32)
Calcium: 8.6 mg/dL — ABNORMAL LOW (ref 8.9–10.3)
Chloride: 102 mmol/L (ref 98–111)
Creatinine, Ser: 1 mg/dL (ref 0.61–1.24)
GFR, Estimated: >60 mL/min (ref >60)
Glucose, Bld: 167 mg/dL — ABNORMAL HIGH (ref 70–99)
Potassium: 4 mmol/L (ref 3.5–5.1)
Sodium: 136 mmol/L (ref 135–145)

## 2021-12-08 LAB — CBC
HCT: 44.9 % (ref 39.0–52.0)
Hemoglobin: 14.7 g/dL (ref 13.0–17.0)
MCH: 28.4 pg (ref 26.0–34.0)
MCHC: 32.7 g/dL (ref 30.0–36.0)
MCV: 86.7 fL (ref 80.0–100.0)
Platelets: 133 10*3/uL — ABNORMAL LOW (ref 150–400)
RBC: 5.18 MIL/uL (ref 4.22–5.81)
RDW: 14.5 % (ref 11.5–15.5)
WBC: 13.4 10*3/uL — ABNORMAL HIGH (ref 4.0–10.5)
nRBC: 0 % (ref 0.0–0.2)

## 2021-12-08 LAB — RESP PANEL BY RT-PCR (FLU A&B, COVID) ARPGX2
Influenza A by PCR: NEGATIVE
Influenza B by PCR: NEGATIVE
SARS Coronavirus 2 by RT PCR: NEGATIVE

## 2021-12-08 MED ORDER — CHLORHEXIDINE GLUCONATE CLOTH 2 % EX PADS
6.0000 | MEDICATED_PAD | Freq: Once | CUTANEOUS | Status: AC
  Administered 2021-12-09: 6 via TOPICAL

## 2021-12-08 MED ORDER — KETOROLAC TROMETHAMINE 15 MG/ML IJ SOLN
15.0000 mg | Freq: Three times a day (TID) | INTRAMUSCULAR | Status: AC
  Administered 2021-12-08 – 2021-12-13 (×13): 15 mg via INTRAVENOUS
  Filled 2021-12-08 (×12): qty 1

## 2021-12-08 MED ORDER — KETOROLAC TROMETHAMINE 60 MG/2ML IM SOLN
60.0000 mg | Freq: Once | INTRAMUSCULAR | Status: DC
Start: 2021-12-08 — End: 2021-12-08

## 2021-12-08 MED ORDER — OXYCODONE HCL 5 MG PO TABS
5.0000 mg | ORAL_TABLET | ORAL | Status: DC | PRN
  Administered 2021-12-08 – 2021-12-14 (×20): 5 mg via ORAL
  Filled 2021-12-08 (×22): qty 1

## 2021-12-08 MED ORDER — GABAPENTIN 600 MG PO TABS
300.0000 mg | ORAL_TABLET | Freq: Two times a day (BID) | ORAL | Status: DC
  Administered 2021-12-08 – 2021-12-14 (×12): 300 mg via ORAL
  Filled 2021-12-08 (×13): qty 1

## 2021-12-08 MED ORDER — CEFAZOLIN SODIUM-DEXTROSE 2-4 GM/100ML-% IV SOLN
2.0000 g | INTRAVENOUS | Status: AC
  Administered 2021-12-09: 2 g via INTRAVENOUS

## 2021-12-08 MED ORDER — KETOROLAC TROMETHAMINE 60 MG/2ML IM SOLN: 15.0000 mg | Freq: Three times a day (TID) | INTRAMUSCULAR | Status: DC
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
## 2021-12-08 NOTE — TOC CAGE-AID Note (Signed)
Transition of Care [facility]) - CAGE-AID Screening   Patient Details  Name: Douglas Lewis MRN: [id number] Date of Birth: [birth year]  Transition of [facility]) CM/SW Contact:    Gaetano Hawthorne Tarpley-Carter, [place] Phone Number: 12/08/2021, 3:12 PM   Clinical Narrative: Pt participated in [place].  Pt stated he does not use substance or ETOH.  Pt was not offered resources, due to no usage of substance or ETOH.    Kerry-Anne Mezo Tarpley-Carter, MSW, LCSW-A Pronouns:  She/Her/Hers [place] Transitions of Care Clinical Social Worker Direct Number:  [phone] [email or web address]  CAGE-AID Screening:    Have You Ever Felt You Ought to Cut Down on Your Drinking or Drug Use?: No Have People Annoyed You By [employer] Your Drinking Or Drug Use?: No Have You Felt Bad Or Guilty About Your Drinking Or Drug Use?: No Have You Ever Had a Drink or Used Drugs First Thing In The Morning to Steady Your Nerves or to Get Rid of a Hangover?: No CAGE-AID Score: 0  Substance Abuse Education Offered: No
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
## 2021-12-08 NOTE — Progress Notes (Addendum)
°  Transition of Care [facility] 2) Screening Note   Patient Details  Name: Douglas Lewis Date of Birth: 1955/12/22   Transition of Care [facility]) CM/SW Contact:    Pollie Friar, RN Phone Number: 12/08/2021, 1:52 PM    Transition of Care Department [facility]) has reviewed patient. We will continue to monitor patient advancement through interdisciplinary progression rounds. If new patient transition needs arise, please place a TOC consult.
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
## 2021-12-08 NOTE — Progress Notes (Signed)
? ?  Inpatient Rehab Admissions Coordinator : ? ?Per therapy recommendations, patient was screened for CIR candidacy by Christon Gallaway RN MSN.  At this time patient appears to be a potential candidate for CIR. I will place a rehab consult per protocol for full assessment. Please call me with any questions. ? ?Abbagail Scaff RN MSN ?Admissions Coordinator ?[phone] ?  ?
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
## 2021-12-09 ENCOUNTER — Other Ambulatory Visit: Payer: Self-pay

## 2021-12-09 ENCOUNTER — Encounter (HOSPITAL_COMMUNITY): Payer: Self-pay | Admitting: Internal Medicine

## 2021-12-09 ENCOUNTER — Inpatient Hospital Stay (HOSPITAL_COMMUNITY): Payer: 59 | Admitting: Anesthesiology

## 2021-12-09 ENCOUNTER — Inpatient Hospital Stay (HOSPITAL_COMMUNITY): Payer: 59

## 2021-12-09 ENCOUNTER — Encounter (HOSPITAL_COMMUNITY)
Admission: EM | Disposition: A | Discharge status: Skilled Nursing Facility | Payer: Self-pay | Source: Home / Self Care | Attending: Neurosurgery

## 2021-12-09 DIAGNOSIS — S32030A Wedge compression fracture of third lumbar vertebra, initial encounter for closed fracture: Secondary | ICD-10-CM | POA: Diagnosis not present

## 2021-12-09 HISTORY — PX: APPLICATION OF ROBOTIC ASSISTANCE FOR SPINAL PROCEDURE: SHX6753

## 2021-12-09 HISTORY — PX: LUMBAR PERCUTANEOUS PEDICLE SCREW 2 LEVEL: SHX5561

## 2021-12-09 LAB — CBC
HCT: 45.5 % (ref 39.0–52.0)
HCT: 47 % (ref 39.0–52.0)
Hemoglobin: 15.5 g/dL (ref 13.0–17.0)
Hemoglobin: 15.9 g/dL (ref 13.0–17.0)
MCH: 29.4 pg (ref 26.0–34.0)
MCH: 29.4 pg (ref 26.0–34.0)
MCHC: 34.1 g/dL (ref 30.0–36.0)
MCHC: 33.8 g/dL (ref 30.0–36.0)
MCV: 86.3 fL (ref 80.0–100.0)
MCV: 86.9 fL (ref 80.0–100.0)
Platelets: 125 10*3/uL — ABNORMAL LOW (ref 150–400)
Platelets: 141 10*3/uL — ABNORMAL LOW (ref 150–400)
RBC: 5.27 MIL/uL (ref 4.22–5.81)
RBC: 5.41 MIL/uL (ref 4.22–5.81)
RDW: 14.3 % (ref 11.5–15.5)
RDW: 14.2 % (ref 11.5–15.5)
WBC: 13.2 10*3/uL — ABNORMAL HIGH (ref 4.0–10.5)
WBC: 20.3 10*3/uL — ABNORMAL HIGH (ref 4.0–10.5)
nRBC: 0 % (ref 0.0–0.2)
nRBC: 0 % (ref 0.0–0.2)

## 2021-12-09 LAB — BASIC METABOLIC PANEL
Anion gap: 10 (ref 5–15)
BUN: 13 mg/dL (ref 8–23)
CO2: 23 mmol/L (ref 22–32)
Calcium: 8.5 mg/dL — ABNORMAL LOW (ref 8.9–10.3)
Chloride: 103 mmol/L (ref 98–111)
Creatinine, Ser: 0.84 mg/dL (ref 0.61–1.24)
GFR, Estimated: >60 mL/min (ref >60)
Glucose, Bld: 105 mg/dL — ABNORMAL HIGH (ref 70–99)
Potassium: 4 mmol/L (ref 3.5–5.1)
Sodium: 136 mmol/L (ref 135–145)

## 2021-12-09 LAB — CREATININE, SERUM
Creatinine, Ser: 0.94 mg/dL (ref 0.61–1.24)
GFR, Estimated: >60 mL/min (ref >60)

## 2021-12-09 LAB — SURGICAL PCR SCREEN
MRSA, PCR: NEGATIVE
Staphylococcus aureus: NEGATIVE

## 2021-12-09 LAB — ABO/RH: ABO/RH(D): A POS

## 2021-12-09 LAB — TYPE AND SCREEN
ABO/RH(D): A POS
Antibody Screen: NEGATIVE

## 2021-12-09 SURGERY — LUMBAR PERCUTANEOUS PEDICLE SCREW 2 LEVEL
Anesthesia: General

## 2021-12-09 MED ORDER — PHENYLEPHRINE HCL (PRESSORS) 10 MG/ML IV SOLN
INTRAVENOUS | Status: DC | PRN
  Administered 2021-12-09: 80 ug via INTRAVENOUS

## 2021-12-09 MED ORDER — PROPOFOL 10 MG/ML IV BOLUS
INTRAVENOUS | Status: AC
  Filled 2021-12-09: qty 20

## 2021-12-09 MED ORDER — CEFAZOLIN SODIUM-DEXTROSE 2-4 GM/100ML-% IV SOLN
2.0000 g | Freq: Three times a day (TID) | INTRAVENOUS | Status: AC
  Administered 2021-12-09 – 2021-12-10 (×2): 2 g via INTRAVENOUS
  Filled 2021-12-09 (×2): qty 100

## 2021-12-09 MED ORDER — ROCURONIUM BROMIDE 10 MG/ML (PF) SYRINGE
PREFILLED_SYRINGE | INTRAVENOUS | Status: DC | PRN
  Administered 2021-12-09: 60 mg via INTRAVENOUS
  Administered 2021-12-09: 40 mg via INTRAVENOUS

## 2021-12-09 MED ORDER — MENTHOL 3 MG MT LOZG
1.0000 | LOZENGE | OROMUCOSAL | Status: DC | PRN
  Filled 2021-12-09: qty 9

## 2021-12-09 MED ORDER — METHYLPREDNISOLONE ACETATE 80 MG/ML IJ SUSP
INTRAMUSCULAR | Status: AC
  Filled 2021-12-09: qty 1

## 2021-12-09 MED ORDER — LIDOCAINE 2% (20 MG/ML) 5 ML SYRINGE
INTRAMUSCULAR | Status: DC | PRN
  Administered 2021-12-09: 100 mg via INTRAVENOUS

## 2021-12-09 MED ORDER — CHLORHEXIDINE GLUCONATE 0.12 % MT SOLN
15.0000 mL | Freq: Once | OROMUCOSAL | Status: AC
  Administered 2021-12-09: 15 mL via OROMUCOSAL

## 2021-12-09 MED ORDER — LACTATED RINGERS IV SOLN: INTRAVENOUS | Status: DC

## 2021-12-09 MED ORDER — SODIUM CHLORIDE 0.9% FLUSH: 3.0000 mL | INTRAVENOUS | Status: DC | PRN

## 2021-12-09 MED ORDER — THROMBIN 5000 UNITS EX SOLR
CUTANEOUS | Status: AC
  Filled 2021-12-09: qty 5000

## 2021-12-09 MED ORDER — ORAL CARE MOUTH RINSE: 15.0000 mL | Freq: Once | OROMUCOSAL | Status: AC

## 2021-12-09 MED ORDER — DEXAMETHASONE SODIUM PHOSPHATE 10 MG/ML IJ SOLN
INTRAMUSCULAR | Status: AC
  Filled 2021-12-09: qty 2

## 2021-12-09 MED ORDER — MIDAZOLAM HCL 2 MG/2ML IJ SOLN
INTRAMUSCULAR | Status: DC | PRN
Start: 2021-12-09 — End: 2021-12-09
  Administered 2021-12-09: 2 mg via INTRAVENOUS

## 2021-12-09 MED ORDER — HYDROMORPHONE HCL 1 MG/ML IJ SOLN
INTRAMUSCULAR | Status: DC | PRN
  Administered 2021-12-09: .5 mg via INTRAVENOUS

## 2021-12-09 MED ORDER — SODIUM CHLORIDE 0.9 % IV SOLN: INTRAVENOUS | Status: DC

## 2021-12-09 MED ORDER — EPHEDRINE 5 MG/ML INJ
INTRAVENOUS | Status: AC
  Filled 2021-12-09: qty 5

## 2021-12-09 MED ORDER — BUPIVACAINE HCL (PF) 0.5 % IJ SOLN
INTRAMUSCULAR | Status: AC
  Filled 2021-12-09: qty 30

## 2021-12-09 MED ORDER — BUPIVACAINE HCL (PF) 0.5 % IJ SOLN
INTRAMUSCULAR | Status: DC | PRN
  Administered 2021-12-09: 9 mL

## 2021-12-09 MED ORDER — HYDROMORPHONE HCL 1 MG/ML IJ SOLN
INTRAMUSCULAR | Status: AC
  Administered 2021-12-10: 0.25 mg via INTRAVENOUS
  Filled 2021-12-09: qty 1

## 2021-12-09 MED ORDER — PROMETHAZINE HCL 25 MG/ML IJ SOLN: 6.2500 mg | INTRAMUSCULAR | Status: DC | PRN

## 2021-12-09 MED ORDER — HYDROMORPHONE HCL 1 MG/ML IJ SOLN
INTRAMUSCULAR | Status: AC
  Filled 2021-12-09: qty 0.5

## 2021-12-09 MED ORDER — LIDOCAINE-EPINEPHRINE 1 %-1:100000 IJ SOLN
INTRAMUSCULAR | Status: AC
  Filled 2021-12-09: qty 1

## 2021-12-09 MED ORDER — HYDROMORPHONE HCL 1 MG/ML IJ SOLN
1.0000 mg | Freq: Once | INTRAMUSCULAR | Status: AC
  Administered 2021-12-09: 1 mg via INTRAVENOUS

## 2021-12-09 MED ORDER — ONDANSETRON HCL 4 MG/2ML IJ SOLN
INTRAMUSCULAR | Status: DC | PRN
  Administered 2021-12-09: 4 mg via INTRAVENOUS

## 2021-12-09 MED ORDER — DOCUSATE SODIUM 100 MG PO CAPS
100.0000 mg | ORAL_CAPSULE | Freq: Two times a day (BID) | ORAL | Status: DC
  Administered 2021-12-09 – 2021-12-12 (×6): 100 mg via ORAL
  Filled 2021-12-09 (×6): qty 1

## 2021-12-09 MED ORDER — SODIUM CHLORIDE 0.9 % IV SOLN: 250.0000 mL | INTRAVENOUS | Status: DC

## 2021-12-09 MED ORDER — LABETALOL HCL 5 MG/ML IV SOLN
INTRAVENOUS | Status: AC
  Filled 2021-12-09: qty 4

## 2021-12-09 MED ORDER — PANTOPRAZOLE SODIUM 40 MG IV SOLR
40.0000 mg | Freq: Every day | INTRAVENOUS | Status: DC
  Administered 2021-12-09 – 2021-12-11 (×3): 40 mg via INTRAVENOUS
  Filled 2021-12-09 (×3): qty 40

## 2021-12-09 MED ORDER — THROMBIN 20000 UNITS EX SOLR
CUTANEOUS | Status: AC
  Filled 2021-12-09: qty 20000

## 2021-12-09 MED ORDER — FENTANYL CITRATE (PF) 250 MCG/5ML IJ SOLN
INTRAMUSCULAR | Status: AC
  Filled 2021-12-09: qty 5

## 2021-12-09 MED ORDER — ACETAMINOPHEN 10 MG/ML IV SOLN
INTRAVENOUS | Status: DC | PRN
  Administered 2021-12-09: 1000 mg via INTRAVENOUS

## 2021-12-09 MED ORDER — ONDANSETRON HCL 4 MG/2ML IJ SOLN
INTRAMUSCULAR | Status: AC
  Filled 2021-12-09: qty 4

## 2021-12-09 MED ORDER — BACITRACIN ZINC 500 UNIT/GM EX OINT
TOPICAL_OINTMENT | CUTANEOUS | Status: DC | PRN
  Administered 2021-12-09: 1 via TOPICAL

## 2021-12-09 MED ORDER — 0.9 % SODIUM CHLORIDE (POUR BTL) OPTIME
TOPICAL | Status: DC | PRN
  Administered 2021-12-09: 1000 mL

## 2021-12-09 MED ORDER — CHLORHEXIDINE GLUCONATE CLOTH 2 % EX PADS
6.0000 | MEDICATED_PAD | Freq: Every day | CUTANEOUS | Status: DC
  Administered 2021-12-09 – 2021-12-16 (×8): 6 via TOPICAL

## 2021-12-09 MED ORDER — ACETAMINOPHEN 10 MG/ML IV SOLN
INTRAVENOUS | Status: AC
  Filled 2021-12-09: qty 100

## 2021-12-09 MED ORDER — BACITRACIN ZINC 500 UNIT/GM EX OINT
TOPICAL_OINTMENT | CUTANEOUS | Status: AC
  Filled 2021-12-09: qty 28.35

## 2021-12-09 MED ORDER — LABETALOL HCL 5 MG/ML IV SOLN
10.0000 mg | Freq: Once | INTRAVENOUS | Status: AC
  Administered 2021-12-09: 10 mg via INTRAVENOUS

## 2021-12-09 MED ORDER — DEXAMETHASONE SODIUM PHOSPHATE 10 MG/ML IJ SOLN
INTRAMUSCULAR | Status: DC | PRN
  Administered 2021-12-09: 10 mg via INTRAVENOUS

## 2021-12-09 MED ORDER — ROCURONIUM BROMIDE 10 MG/ML (PF) SYRINGE
PREFILLED_SYRINGE | INTRAVENOUS | Status: AC
  Filled 2021-12-09: qty 40

## 2021-12-09 MED ORDER — HYDROMORPHONE HCL 1 MG/ML IJ SOLN
0.2500 mg | INTRAMUSCULAR | Status: DC | PRN
  Administered 2021-12-09: 0.5 mg via INTRAVENOUS

## 2021-12-09 MED ORDER — THROMBIN 5000 UNITS EX SOLR
CUTANEOUS | Status: DC | PRN
  Administered 2021-12-09: 5 mL via TOPICAL

## 2021-12-09 MED ORDER — PHENYLEPHRINE HCL-NACL 20-0.9 MG/250ML-% IV SOLN
INTRAVENOUS | Status: DC | PRN
  Administered 2021-12-09: 25 ug/min via INTRAVENOUS

## 2021-12-09 MED ORDER — LIDOCAINE-EPINEPHRINE 1 %-1:100000 IJ SOLN
INTRAMUSCULAR | Status: DC | PRN
  Administered 2021-12-09: 9 mL

## 2021-12-09 MED ORDER — PROPOFOL 10 MG/ML IV BOLUS
INTRAVENOUS | Status: DC | PRN
  Administered 2021-12-09: 100 mg via INTRAVENOUS

## 2021-12-09 MED ORDER — HYDROMORPHONE HCL 1 MG/ML IJ SOLN
INTRAMUSCULAR | Status: AC
  Administered 2021-12-09: 1 mg via INTRAVENOUS
  Filled 2021-12-09: qty 1

## 2021-12-09 MED ORDER — LIDOCAINE 2% (20 MG/ML) 5 ML SYRINGE
INTRAMUSCULAR | Status: AC
  Filled 2021-12-09: qty 10

## 2021-12-09 MED ORDER — PHENYLEPHRINE 40 MCG/ML (10ML) SYRINGE FOR IV PUSH (FOR BLOOD PRESSURE SUPPORT)
PREFILLED_SYRINGE | INTRAVENOUS | Status: AC
  Filled 2021-12-09: qty 20

## 2021-12-09 MED ORDER — FENTANYL CITRATE (PF) 250 MCG/5ML IJ SOLN
INTRAMUSCULAR | Status: DC | PRN
  Administered 2021-12-09 (×4): 50 ug via INTRAVENOUS

## 2021-12-09 MED ORDER — HYDROMORPHONE HCL 1 MG/ML IJ SOLN: 1.0000 mg | Freq: Once | INTRAMUSCULAR | Status: AC

## 2021-12-09 MED ORDER — MIDAZOLAM HCL 2 MG/2ML IJ SOLN
INTRAMUSCULAR | Status: AC
  Filled 2021-12-09: qty 2

## 2021-12-09 MED ORDER — PHENOL 1.4 % MT LIQD: 1.0000 | OROMUCOSAL | Status: DC | PRN

## 2021-12-09 MED ORDER — SODIUM CHLORIDE 0.9% FLUSH
3.0000 mL | Freq: Two times a day (BID) | INTRAVENOUS | Status: DC
  Administered 2021-12-09 – 2021-12-15 (×12): 3 mL via INTRAVENOUS

## 2021-12-09 MED ORDER — LACTATED RINGERS IV SOLN
INTRAVENOUS | Status: DC | PRN
Start: 2021-12-09 — End: 2021-12-09

## 2021-12-09 MED ORDER — BISACODYL 10 MG RE SUPP
10.0000 mg | Freq: Every day | RECTAL | Status: DC | PRN
  Administered 2021-12-12: 10 mg via RECTAL
  Filled 2021-12-09: qty 1

## 2021-12-09 SURGICAL SUPPLY — 81 items
ADH SKN CLS LQ APL DERMABOND (GAUZE/BANDAGES/DRESSINGS)
APL SKNCLS STERI-STRIP NONHPOA (GAUZE/BANDAGES/DRESSINGS)
BAG COUNTER SPONGE SURGICOUNT (BAG) ×4 IMPLANT
BAG SPNG CNTER NS LX DISP (BAG) ×2
BENZOIN TINCTURE PRP APPL 2/3 (GAUZE/BANDAGES/DRESSINGS) IMPLANT
BIT DRILL LONG 3.0X30 (BIT) ×1 IMPLANT
BIT DRILL LONG 3X80 (BIT) IMPLANT
BIT DRILL LONG 4X80 (BIT) IMPLANT
BIT DRILL SHORT 3.0X30 (BIT) IMPLANT
BIT DRILL SHORT 3X80 (BIT) IMPLANT
BLADE CLIPPER SURG (BLADE) ×1 IMPLANT
BLADE SURG 11 STRL SS (BLADE) ×4 IMPLANT
BUR MATCHSTICK NEURO 3.0 LAGG (BURR) ×1 IMPLANT
BUR PRECISION FLUTE 5.0 (BURR) ×1 IMPLANT
CANISTER SUCT 3000ML PPV (MISCELLANEOUS) ×2 IMPLANT
CARTRIDGE OIL MAESTRO DRILL (MISCELLANEOUS) ×1 IMPLANT
CNTNR URN SCR LID CUP LEK RST (MISCELLANEOUS) ×1 IMPLANT
CONT SPEC 4OZ STRL OR WHT (MISCELLANEOUS) ×2
COVER BACK TABLE 60X90IN (DRAPES) ×2 IMPLANT
DECANTER SPIKE VIAL GLASS SM (MISCELLANEOUS) ×2 IMPLANT
DERMABOND ADHESIVE PROPEN (GAUZE/BANDAGES/DRESSINGS)
DERMABOND ADVANCED .7 DNX6 (GAUZE/BANDAGES/DRESSINGS) IMPLANT
DIFFUSER DRILL AIR PNEUMATIC (MISCELLANEOUS) ×1 IMPLANT
DRAPE C-ARM 42X72 X-RAY (DRAPES) ×3 IMPLANT
DRAPE C-ARMOR (DRAPES) ×2 IMPLANT
DRAPE LAPAROTOMY 100X72X124 (DRAPES) ×2 IMPLANT
DRAPE SHEET LG 3/4 BI-LAMINATE (DRAPES) ×2 IMPLANT
DRAPE SURG 17X23 STRL (DRAPES) ×2 IMPLANT
DRSG OPSITE POSTOP 4X6 (GAUZE/BANDAGES/DRESSINGS) ×2 IMPLANT
DURAPREP 26ML APPLICATOR (WOUND CARE) ×2 IMPLANT
ELECT REM PT RETURN 9FT ADLT (ELECTROSURGICAL) ×2
ELECTRODE REM PT RTRN 9FT ADLT (ELECTROSURGICAL) ×1 IMPLANT
EXTENDER TAB GUIDE SV 5.5/6.0 (INSTRUMENTS) ×12 IMPLANT
GAUZE 4X4 16PLY ~~LOC~~+RFID DBL (SPONGE) ×1 IMPLANT
GAUZE SPONGE 4X4 12PLY STRL (GAUZE/BANDAGES/DRESSINGS) IMPLANT
GLOVE EXAM NITRILE XL STR (GLOVE) IMPLANT
GLOVE SURG ENC MOIS LTX SZ7 (GLOVE) IMPLANT
GLOVE SURG LTX SZ7 (GLOVE) ×2 IMPLANT
GLOVE SURG UNDER POLY LF SZ7 (GLOVE) IMPLANT
GLOVE SURG UNDER POLY LF SZ7.5 (GLOVE) ×2 IMPLANT
GOWN STRL REUS W/ TWL LRG LVL3 (GOWN DISPOSABLE) ×2 IMPLANT
GOWN STRL REUS W/ TWL XL LVL3 (GOWN DISPOSABLE) IMPLANT
GOWN STRL REUS W/TWL 2XL LVL3 (GOWN DISPOSABLE) IMPLANT
GOWN STRL REUS W/TWL LRG LVL3 (GOWN DISPOSABLE) ×4
GOWN STRL REUS W/TWL XL LVL3 (GOWN DISPOSABLE)
GUIDEWIRE SHARP NITINOL 610 (WIRE) ×6 IMPLANT
HEMOSTAT POWDER KIT SURGIFOAM (HEMOSTASIS) ×2 IMPLANT
KIT BASIN OR (CUSTOM PROCEDURE TRAY) ×2 IMPLANT
KIT SPINE MAZOR X ROBO DISP (MISCELLANEOUS) ×2 IMPLANT
KIT TURNOVER KIT B (KITS) ×2 IMPLANT
NDL HYPO 18GX1.5 BLUNT FILL (NEEDLE) IMPLANT
NDL HYPO 25X1 1.5 SAFETY (NEEDLE) ×1 IMPLANT
NDL SPNL 18GX3.5 QUINCKE PK (NEEDLE) ×1 IMPLANT
NEEDLE HYPO 18GX1.5 BLUNT FILL (NEEDLE) IMPLANT
NEEDLE HYPO 25X1 1.5 SAFETY (NEEDLE) ×2 IMPLANT
NEEDLE SPNL 18GX3.5 QUINCKE PK (NEEDLE) ×2 IMPLANT
NS IRRIG 1000ML POUR BTL (IV SOLUTION) ×2 IMPLANT
OIL CARTRIDGE MAESTRO DRILL (MISCELLANEOUS)
PACK LAMINECTOMY NEURO (CUSTOM PROCEDURE TRAY) ×2 IMPLANT
PAD ARMBOARD 7.5X6 YLW CONV (MISCELLANEOUS) ×6 IMPLANT
PIN HEAD 2.5X60MM (PIN) IMPLANT
ROD PERC 5.5X75 (Rod) ×2 IMPLANT
SCREW 5.5 VOYAGER MAS 7.5X50 (Screw) ×2 IMPLANT
SCREW MAS VOYAGER 7.5X45 (Screw) ×4 IMPLANT
SCREW SCHANZ SA 4.0MM (MISCELLANEOUS) ×1 IMPLANT
SCREW SET 5.5/6.0MM SOLERA (Screw) ×6 IMPLANT
SPONGE SURGIFOAM ABS GEL 100 (HEMOSTASIS) ×1 IMPLANT
SPONGE T-LAP 4X18 ~~LOC~~+RFID (SPONGE) ×1 IMPLANT
STAPLER VISISTAT 35W (STAPLE) ×2 IMPLANT
STRIP CLOSURE SKIN 1/2X4 (GAUZE/BANDAGES/DRESSINGS) IMPLANT
SUT VIC AB 0 CT1 18XCR BRD8 (SUTURE) ×1 IMPLANT
SUT VIC AB 0 CT1 8-18 (SUTURE) ×6
SUT VIC AB 3-0 SH 8-18 (SUTURE) ×1 IMPLANT
SUT VICRYL 3-0 RB1 18 ABS (SUTURE) ×3 IMPLANT
SYR 3ML LL SCALE MARK (SYRINGE) IMPLANT
TOWEL GREEN STERILE (TOWEL DISPOSABLE) ×2 IMPLANT
TOWEL GREEN STERILE FF (TOWEL DISPOSABLE) ×2 IMPLANT
TRAP SPECIMEN MUCUS 40CC (MISCELLANEOUS) ×1 IMPLANT
TRAY FOLEY MTR SLVR 16FR STAT (SET/KITS/TRAYS/PACK) ×2 IMPLANT
TUBE MAZOR SA REDUCTION (TUBING) ×2 IMPLANT
WATER STERILE IRR 1000ML POUR (IV SOLUTION) ×2 IMPLANT
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
## 2021-12-09 NOTE — Anesthesia Preprocedure Evaluation (Addendum)
Anesthesia Evaluation  Patient identified by MRN, date of birth, ID band Patient awake    Reviewed: Allergy & Precautions, NPO status , Patient's Chart, lab work & pertinent test results  Airway Mallampati: II  TM Distance: >3 FB Neck ROM: Full    Dental  (+) Dental Advisory Given, Edentulous Upper, Missing, Poor Dentition   Pulmonary neg pulmonary ROS,    Pulmonary exam normal breath sounds clear to auscultation       Cardiovascular negative cardio ROS Normal cardiovascular exam Rhythm:Regular Rate:Normal     Neuro/Psych Degenerative cervical stenosis   LUMBAR 3 FX    GI/Hepatic negative GI ROS, Neg liver ROS,   Endo/Other  negative endocrine ROS  Renal/GU negative Renal ROS     Musculoskeletal negative musculoskeletal ROS (+)   Abdominal   Peds  Hematology  (+) Blood dyscrasia (Thrombocytopenia), ,   Anesthesia Other Findings   Reproductive/Obstetrics                            Anesthesia Physical Anesthesia Plan  ASA: 2  Anesthesia Plan: General   Post-op Pain Management: Tylenol PO (pre-op)   Induction: Intravenous  PONV Risk Score and Plan: 2 and Midazolam, Dexamethasone and Ondansetron  Airway Management Planned: Oral ETT  Additional Equipment:   Intra-op Plan:   Post-operative Plan: Extubation in OR  Informed Consent: I have reviewed the patients History and Physical, chart, labs and discussed the procedure including the risks, benefits and alternatives for the proposed anesthesia with the patient or authorized representative who has indicated his/her understanding and acceptance.     Dental advisory given  Plan Discussed with: CRNA  Anesthesia Plan Comments: (2nd large bore PIV)       Anesthesia Quick Evaluation

## 2021-12-09 NOTE — Anesthesia Procedure Notes (Signed)
Procedure Name: Intubation Date/Time: 12/09/2021 3:14 PM Performed by: Clearnce Sorrel, CRNA Pre-anesthesia Checklist: Patient identified, Emergency Drugs available, Suction available and Patient being monitored Patient Re-evaluated:Patient Re-evaluated prior to induction Oxygen Delivery Method: Circle System Utilized Preoxygenation: Pre-oxygenation with 100% oxygen Induction Type: IV induction Ventilation: Mask ventilation without difficulty Laryngoscope Size: Mac and 4 Grade View: Grade I Tube type: Oral Tube size: 7.5 mm Number of attempts: 1 Airway Equipment and Method: Stylet and Oral airway Placement Confirmation: ETT inserted through vocal cords under direct vision, positive ETCO2 and breath sounds checked- equal and bilateral Secured at: 23 cm Tube secured with: Tape Dental Injury: Teeth and Oropharynx as per pre-operative assessment

## 2021-12-09 NOTE — Progress Notes (Signed)
PT Cancellation Note  Patient Details Name: Taylor Levick MRN: [id number] DOB: 07-10-1956   Cancelled Treatment:    Reason Eval/Treat Not Completed: Patient at procedure or test/unavailable;Other (comment) (In surgery most of the day.  Will hold for today and try to re eval 2/4). 12/09/2021  Ginger Carne., PT Acute Rehabilitation Services [phone]  (pager) [phone]  (office)   Tessie Fass Norvil Martensen 12/09/2021, 3:23 PM

## 2021-12-09 NOTE — Transfer of Care (Signed)
Immediate Anesthesia Transfer of Care Note  Patient: Douglas Lewis  Procedure(s) Performed: OPEN REDUCATION INTERNAL FIXATION LUMBAR THREE FRACTURE, LUMBAR TWO- THREE, LUMBAR THREE- FOUR STABILIZATION APPLICATION OF ROBOTIC ASSISTANCE FOR SPINAL PROCEDURE  Patient Location: PACU  Anesthesia Type:General  Level of Consciousness: awake  Airway & Oxygen Therapy: Patient Spontanous Breathing  Post-op Assessment: Report given to RN and Post -op Vital signs reviewed and stable  Post vital signs: Reviewed and stable  Last Vitals:  Vitals Value Taken Time  BP    Temp    Pulse    Resp    SpO2      Last Pain:  Vitals:   12/09/21 1413  TempSrc:   PainSc: 10-Worst pain ever      Patients Stated Pain Goal: 3 (27/25/36 6440)  Complications: No notable events documented.

## 2021-12-09 NOTE — Care Management Important Message (Signed)
Important Message  Patient Details  Name: Douglas Lewis MRN: [id number] Date of Birth: [birth year]   Medicare Important Message Given:  Yes     Orbie Pyo 12/09/2021, 2:12 PM

## 2021-12-09 NOTE — Progress Notes (Signed)
Inpatient Rehab Admissions Coordinator:   Note pending surgery today.  Will f/u Monday.   Shann Medal, PT, DPT Admissions Coordinator [phone] 12/09/21  10:06 AM
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
## 2021-12-10 ENCOUNTER — Inpatient Hospital Stay (HOSPITAL_COMMUNITY): Payer: 59 | Admitting: Certified Registered Nurse Anesthetist

## 2021-12-10 ENCOUNTER — Encounter (HOSPITAL_COMMUNITY): Payer: Self-pay | Admitting: Internal Medicine

## 2021-12-10 ENCOUNTER — Other Ambulatory Visit: Payer: Self-pay

## 2021-12-10 ENCOUNTER — Inpatient Hospital Stay (HOSPITAL_COMMUNITY): Payer: 59

## 2021-12-10 ENCOUNTER — Encounter (HOSPITAL_COMMUNITY)
Admission: EM | Disposition: A | Discharge status: Skilled Nursing Facility | Payer: Self-pay | Source: Home / Self Care | Attending: Neurosurgery

## 2021-12-10 HISTORY — PX: LUMBAR LAMINECTOMY/DECOMPRESSION MICRODISCECTOMY: SHX5026

## 2021-12-10 SURGERY — LUMBAR LAMINECTOMY/DECOMPRESSION MICRODISCECTOMY 1 LEVEL
Anesthesia: General | Site: Back

## 2021-12-10 MED ORDER — LIDOCAINE 2% (20 MG/ML) 5 ML SYRINGE
INTRAMUSCULAR | Status: AC
  Filled 2021-12-10: qty 5

## 2021-12-10 MED ORDER — LACTATED RINGERS IV SOLN: INTRAVENOUS | Status: DC

## 2021-12-10 MED ORDER — CHLORHEXIDINE GLUCONATE 0.12 % MT SOLN
OROMUCOSAL | Status: AC
  Administered 2021-12-10: 15 mL via OROMUCOSAL
  Filled 2021-12-10: qty 15

## 2021-12-10 MED ORDER — KETAMINE HCL 50 MG/5ML IJ SOSY
PREFILLED_SYRINGE | INTRAMUSCULAR | Status: AC
  Filled 2021-12-10: qty 5

## 2021-12-10 MED ORDER — VANCOMYCIN HCL 1000 MG IV SOLR
INTRAVENOUS | Status: AC
  Filled 2021-12-10: qty 20

## 2021-12-10 MED ORDER — THROMBIN 5000 UNITS EX SOLR
OROMUCOSAL | Status: DC | PRN
  Administered 2021-12-10 (×2): 5 mL via TOPICAL

## 2021-12-10 MED ORDER — ONDANSETRON HCL 4 MG/2ML IJ SOLN
INTRAMUSCULAR | Status: DC | PRN
Start: 2021-12-10 — End: 2021-12-10
  Administered 2021-12-10: 4 mg via INTRAVENOUS

## 2021-12-10 MED ORDER — DEXAMETHASONE SODIUM PHOSPHATE 10 MG/ML IJ SOLN
INTRAMUSCULAR | Status: DC | PRN
  Administered 2021-12-10: 10 mg via INTRAVENOUS

## 2021-12-10 MED ORDER — HYDROMORPHONE HCL 1 MG/ML IJ SOLN: 1.0000 mg | INTRAMUSCULAR | Status: DC | PRN

## 2021-12-10 MED ORDER — MIDAZOLAM HCL 2 MG/2ML IJ SOLN
INTRAMUSCULAR | Status: DC | PRN
Start: 2021-12-10 — End: 2021-12-10
  Administered 2021-12-10: 2 mg via INTRAVENOUS

## 2021-12-10 MED ORDER — FENTANYL CITRATE (PF) 250 MCG/5ML IJ SOLN
INTRAMUSCULAR | Status: DC | PRN
  Administered 2021-12-10: 100 ug via INTRAVENOUS
  Administered 2021-12-10: 50 ug via INTRAVENOUS

## 2021-12-10 MED ORDER — MIDAZOLAM HCL 2 MG/2ML IJ SOLN
INTRAMUSCULAR | Status: AC
  Filled 2021-12-10: qty 2

## 2021-12-10 MED ORDER — THROMBIN 5000 UNITS EX SOLR
CUTANEOUS | Status: AC
  Filled 2021-12-10: qty 10000

## 2021-12-10 MED ORDER — AMISULPRIDE (ANTIEMETIC) 5 MG/2ML IV SOLN: 10.0000 mg | Freq: Once | INTRAVENOUS | Status: DC | PRN

## 2021-12-10 MED ORDER — MEPERIDINE HCL 25 MG/ML IJ SOLN: 6.2500 mg | INTRAMUSCULAR | Status: DC | PRN

## 2021-12-10 MED ORDER — PROMETHAZINE HCL 25 MG/ML IJ SOLN: 6.2500 mg | INTRAMUSCULAR | Status: DC | PRN

## 2021-12-10 MED ORDER — ROCURONIUM BROMIDE 10 MG/ML (PF) SYRINGE
PREFILLED_SYRINGE | INTRAVENOUS | Status: DC | PRN
Start: 2021-12-10 — End: 2021-12-10
  Administered 2021-12-10: 50 mg via INTRAVENOUS
  Administered 2021-12-10: 20 mg via INTRAVENOUS

## 2021-12-10 MED ORDER — ROCURONIUM BROMIDE 10 MG/ML (PF) SYRINGE
PREFILLED_SYRINGE | INTRAVENOUS | Status: AC
  Filled 2021-12-10: qty 10

## 2021-12-10 MED ORDER — ACETAMINOPHEN 325 MG PO TABS: 325.0000 mg | ORAL_TABLET | Freq: Once | ORAL | Status: DC | PRN

## 2021-12-10 MED ORDER — MELATONIN 3 MG PO TABS
3.0000 mg | ORAL_TABLET | Freq: Every day | ORAL | Status: DC
  Administered 2021-12-10 – 2021-12-15 (×6): 3 mg via ORAL
  Filled 2021-12-10 (×6): qty 1

## 2021-12-10 MED ORDER — PROPOFOL 10 MG/ML IV BOLUS
INTRAVENOUS | Status: DC | PRN
Start: 2021-12-10 — End: 2021-12-10
  Administered 2021-12-10: 140 mg via INTRAVENOUS

## 2021-12-10 MED ORDER — THROMBIN 5000 UNITS EX SOLR
CUTANEOUS | Status: AC
  Filled 2021-12-10: qty 5000

## 2021-12-10 MED ORDER — HYDROMORPHONE HCL 1 MG/ML IJ SOLN
INTRAMUSCULAR | Status: AC
  Filled 2021-12-10: qty 1

## 2021-12-10 MED ORDER — HYDROMORPHONE HCL 1 MG/ML IJ SOLN
0.2500 mg | INTRAMUSCULAR | Status: DC | PRN
  Administered 2021-12-10 (×2): 0.5 mg via INTRAVENOUS

## 2021-12-10 MED ORDER — LIDOCAINE 2% (20 MG/ML) 5 ML SYRINGE
INTRAMUSCULAR | Status: DC | PRN
Start: 2021-12-10 — End: 2021-12-10
  Administered 2021-12-10: 40 mg via INTRAVENOUS

## 2021-12-10 MED ORDER — BUPIVACAINE HCL (PF) 0.25 % IJ SOLN
INTRAMUSCULAR | Status: AC
  Filled 2021-12-10: qty 30

## 2021-12-10 MED ORDER — VANCOMYCIN HCL 1000 MG IV SOLR
INTRAVENOUS | Status: DC | PRN
  Administered 2021-12-10: 1000 mg

## 2021-12-10 MED ORDER — HEMOSTATIC AGENTS (NO CHARGE) OPTIME
TOPICAL | Status: DC | PRN
  Administered 2021-12-10: 1 via TOPICAL

## 2021-12-10 MED ORDER — HYDROMORPHONE HCL 1 MG/ML IJ SOLN
INTRAMUSCULAR | Status: AC
  Administered 2021-12-10: 0.25 mg via INTRAVENOUS
  Filled 2021-12-10: qty 1

## 2021-12-10 MED ORDER — SUGAMMADEX SODIUM 200 MG/2ML IV SOLN
INTRAVENOUS | Status: DC | PRN
Start: 2021-12-10 — End: 2021-12-10
  Administered 2021-12-10 (×2): 100 mg via INTRAVENOUS

## 2021-12-10 MED ORDER — SUCCINYLCHOLINE CHLORIDE 200 MG/10ML IV SOSY
PREFILLED_SYRINGE | INTRAVENOUS | Status: DC | PRN
Start: 2021-12-10 — End: 2021-12-10
  Administered 2021-12-10: 140 mg via INTRAVENOUS

## 2021-12-10 MED ORDER — ACETAMINOPHEN 10 MG/ML IV SOLN: 1000.0000 mg | Freq: Once | INTRAVENOUS | Status: DC | PRN

## 2021-12-10 MED ORDER — 0.9 % SODIUM CHLORIDE (POUR BTL) OPTIME
TOPICAL | Status: DC | PRN
Start: 2021-12-10 — End: 2021-12-10
  Administered 2021-12-10: 1000 mL

## 2021-12-10 MED ORDER — THROMBIN 5000 UNITS EX SOLR
CUTANEOUS | Status: DC | PRN
Start: 2021-12-10 — End: 2021-12-10
  Administered 2021-12-10 (×2): 5000 [IU] via TOPICAL

## 2021-12-10 MED ORDER — BUPIVACAINE HCL (PF) 0.5 % IJ SOLN
INTRAMUSCULAR | Status: AC
  Filled 2021-12-10: qty 30

## 2021-12-10 MED ORDER — CEFAZOLIN SODIUM-DEXTROSE 2-4 GM/100ML-% IV SOLN
2.0000 g | INTRAVENOUS | Status: AC
  Administered 2021-12-10: 2 g via INTRAVENOUS
  Filled 2021-12-10 (×2): qty 100

## 2021-12-10 MED ORDER — SUCCINYLCHOLINE CHLORIDE 200 MG/10ML IV SOSY
PREFILLED_SYRINGE | INTRAVENOUS | Status: AC
  Filled 2021-12-10: qty 10

## 2021-12-10 MED ORDER — ACETAMINOPHEN 160 MG/5ML PO SOLN: 325.0000 mg | Freq: Once | ORAL | Status: DC | PRN

## 2021-12-10 MED ORDER — KETAMINE HCL 10 MG/ML IJ SOLN
INTRAMUSCULAR | Status: DC | PRN
  Administered 2021-12-10: 30 mg via INTRAVENOUS
  Administered 2021-12-10: 10 mg via INTRAVENOUS

## 2021-12-10 MED ORDER — FENTANYL CITRATE (PF) 250 MCG/5ML IJ SOLN
INTRAMUSCULAR | Status: AC
  Filled 2021-12-10: qty 5

## 2021-12-10 MED ORDER — PROPOFOL 10 MG/ML IV BOLUS
INTRAVENOUS | Status: AC
  Filled 2021-12-10: qty 20

## 2021-12-10 MED ORDER — DEXAMETHASONE SODIUM PHOSPHATE 10 MG/ML IJ SOLN
INTRAMUSCULAR | Status: AC
  Filled 2021-12-10: qty 1

## 2021-12-10 MED ORDER — ONDANSETRON HCL 4 MG/2ML IJ SOLN
INTRAMUSCULAR | Status: AC
  Filled 2021-12-10: qty 2

## 2021-12-10 MED ORDER — HYDROMORPHONE HCL 1 MG/ML IJ SOLN
1.0000 mg | INTRAMUSCULAR | Status: DC | PRN
  Administered 2021-12-10 – 2021-12-14 (×28): 1 mg via INTRAVENOUS
  Filled 2021-12-10 (×29): qty 1

## 2021-12-10 MED ORDER — CEFAZOLIN SODIUM-DEXTROSE 1-4 GM/50ML-% IV SOLN
1.0000 g | Freq: Three times a day (TID) | INTRAVENOUS | Status: AC
  Administered 2021-12-10 – 2021-12-11 (×3): 1 g via INTRAVENOUS
  Filled 2021-12-10 (×3): qty 50

## 2021-12-10 SURGICAL SUPPLY — 54 items
ADH SKN CLS APL DERMABOND .7 (GAUZE/BANDAGES/DRESSINGS) ×1
APL SKNCLS STERI-STRIP NONHPOA (GAUZE/BANDAGES/DRESSINGS) ×1
BAG COUNTER SPONGE SURGICOUNT (BAG) ×3 IMPLANT
BAG DECANTER FOR FLEXI CONT (MISCELLANEOUS) ×2 IMPLANT
BAG SPNG CNTER NS LX DISP (BAG) ×2
BAND INSRT 18 STRL LF DISP RB (MISCELLANEOUS) ×2
BAND RUBBER #18 3X1/16 STRL (MISCELLANEOUS) ×4 IMPLANT
BENZOIN TINCTURE PRP APPL 2/3 (GAUZE/BANDAGES/DRESSINGS) ×2 IMPLANT
BLADE CLIPPER SURG (BLADE) IMPLANT
BUR CUTTER 7.0 ROUND (BURR) ×2 IMPLANT
CANISTER SUCT 3000ML PPV (MISCELLANEOUS) ×2 IMPLANT
CARTRIDGE OIL MAESTRO DRILL (MISCELLANEOUS) ×1 IMPLANT
CLSR STERI-STRIP ANTIMIC 1/2X4 (GAUZE/BANDAGES/DRESSINGS) ×1 IMPLANT
DECANTER SPIKE VIAL GLASS SM (MISCELLANEOUS) ×2 IMPLANT
DERMABOND ADVANCED (GAUZE/BANDAGES/DRESSINGS) ×1
DERMABOND ADVANCED .7 DNX12 (GAUZE/BANDAGES/DRESSINGS) ×1 IMPLANT
DIFFUSER DRILL AIR PNEUMATIC (MISCELLANEOUS) ×2 IMPLANT
DRAPE HALF SHEET 40X57 (DRAPES) IMPLANT
DRAPE LAPAROTOMY 100X72X124 (DRAPES) ×2 IMPLANT
DRAPE MICROSCOPE LEICA (MISCELLANEOUS) ×2 IMPLANT
DRAPE SURG 17X23 STRL (DRAPES) ×4 IMPLANT
DRSG OPSITE POSTOP 3X4 (GAUZE/BANDAGES/DRESSINGS) ×1 IMPLANT
DRSG OPSITE POSTOP 4X6 (GAUZE/BANDAGES/DRESSINGS) ×2 IMPLANT
DURAPREP 26ML APPLICATOR (WOUND CARE) ×2 IMPLANT
ELECT REM PT RETURN 9FT ADLT (ELECTROSURGICAL) ×2
ELECTRODE REM PT RTRN 9FT ADLT (ELECTROSURGICAL) ×1 IMPLANT
GAUZE 4X4 16PLY ~~LOC~~+RFID DBL (SPONGE) ×2 IMPLANT
GAUZE SPONGE 4X4 12PLY STRL (GAUZE/BANDAGES/DRESSINGS) ×2 IMPLANT
GLOVE EXAM NITRILE XL STR (GLOVE) IMPLANT
GLOVE SURG ENC MOIS LTX SZ6.5 (GLOVE) ×2 IMPLANT
GLOVE SURG LTX SZ9 (GLOVE) ×2 IMPLANT
GLOVE SURG UNDER POLY LF SZ6.5 (GLOVE) ×2 IMPLANT
GOWN STRL REUS W/ TWL LRG LVL3 (GOWN DISPOSABLE) IMPLANT
GOWN STRL REUS W/ TWL XL LVL3 (GOWN DISPOSABLE) ×1 IMPLANT
GOWN STRL REUS W/TWL 2XL LVL3 (GOWN DISPOSABLE) IMPLANT
GOWN STRL REUS W/TWL LRG LVL3 (GOWN DISPOSABLE)
GOWN STRL REUS W/TWL XL LVL3 (GOWN DISPOSABLE) ×2
KIT BASIN OR (CUSTOM PROCEDURE TRAY) ×2 IMPLANT
KIT TURNOVER KIT B (KITS) ×2 IMPLANT
NDL SPNL 22GX3.5 QUINCKE BK (NEEDLE) IMPLANT
NEEDLE HYPO 22GX1.5 SAFETY (NEEDLE) ×2 IMPLANT
NEEDLE SPNL 22GX3.5 QUINCKE BK (NEEDLE) IMPLANT
NS IRRIG 1000ML POUR BTL (IV SOLUTION) ×2 IMPLANT
OIL CARTRIDGE MAESTRO DRILL (MISCELLANEOUS) ×2
PACK LAMINECTOMY NEURO (CUSTOM PROCEDURE TRAY) ×2 IMPLANT
PAD ARMBOARD 7.5X6 YLW CONV (MISCELLANEOUS) ×6 IMPLANT
SPONGE SURGIFOAM ABS GEL SZ50 (HEMOSTASIS) ×2 IMPLANT
SPONGE T-LAP 4X18 ~~LOC~~+RFID (SPONGE) ×2 IMPLANT
STRIP CLOSURE SKIN 1/2X4 (GAUZE/BANDAGES/DRESSINGS) ×2 IMPLANT
SUT VIC AB 2-0 CT1 18 (SUTURE) ×3 IMPLANT
SUT VIC AB 3-0 SH 8-18 (SUTURE) ×2 IMPLANT
TOWEL GREEN STERILE (TOWEL DISPOSABLE) ×2 IMPLANT
TOWEL GREEN STERILE FF (TOWEL DISPOSABLE) ×2 IMPLANT
WATER STERILE IRR 1000ML POUR (IV SOLUTION) ×2 IMPLANT

## 2021-12-10 NOTE — Progress Notes (Signed)
Patient complaining of pain and inability to urinate. Patient began to lose sensation in perineum and lower extremities. After STAT MRI patient was found to have an epidural hematoma. Patient informed and consented for hamartoma removal. Patient prepped for surgery and taken down within 30 minutes of orders.

## 2021-12-10 NOTE — Progress Notes (Signed)
Postop day 1.  Patient notes that his back pain is has improved following surgery however he notes increased feelings of weakness in both lower extremities.  He has been unable to void since surgery.  He complains of numbness in his groin and perineum.  He is afebrile.  His vital signs are stable.  He has not had any urine output since his catheter was removed.  Motor examination reveals 4-/5 strength in his hip flexors, quadriceps and dorsiflexors and plantar flexors of his feet.  This is symmetric.  It is somewhat limited by effort but does seem to be real.  Sensory examination with some decrease sensation from L3 distally.  Patient status post L3 fracture status post posterior fixation yesterday.  Patient with worsened weakness and sensory loss after surgery.  We will get stat MRI scan to evaluate for hematoma or other compressive lesion.  He did have some stenosis at L3-4 prior to surgery and will also evaluate for worsening of this.

## 2021-12-10 NOTE — Anesthesia Postprocedure Evaluation (Signed)
Anesthesia Post Note  Patient: Douglas Lewis  Procedure(s) Performed: OPEN REDUCATION INTERNAL FIXATION LUMBAR THREE FRACTURE, LUMBAR TWO- THREE, LUMBAR THREE- FOUR STABILIZATION APPLICATION OF ROBOTIC ASSISTANCE FOR SPINAL PROCEDURE     Patient location during evaluation: PACU Anesthesia Type: General Level of consciousness: awake and alert Pain management: pain level controlled Vital Signs Assessment: post-procedure vital signs reviewed and stable Respiratory status: spontaneous breathing, nonlabored ventilation and respiratory function stable Cardiovascular status: blood pressure returned to baseline and stable Postop Assessment: no apparent nausea or vomiting Anesthetic complications: no   No notable events documented.  Last Vitals:  Vitals:   12/10/21 0014 12/10/21 0800  BP: (!) 144/69 (!) 142/74  Pulse: 77 67  Resp: 18 18  Temp: 36.9 C 36.6 C  SpO2: 94% 97%    Last Pain:  Vitals:   12/10/21 0800  TempSrc: Oral  PainSc:                  Santa Lighter
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
## 2021-12-10 NOTE — Anesthesia Procedure Notes (Signed)
Procedure Name: Intubation Date/Time: 12/10/2021 2:52 PM Performed by: Trinna Post., CRNA Pre-anesthesia Checklist: Patient identified, Emergency Drugs available, Suction available, Patient being monitored and Timeout performed Patient Re-evaluated:Patient Re-evaluated prior to induction Oxygen Delivery Method: Circle system utilized Preoxygenation: Pre-oxygenation with 100% oxygen Induction Type: IV induction, Rapid sequence and Cricoid Pressure applied Laryngoscope Size: Mac and 4 Grade View: Grade I Tube type: Oral Tube size: 7.5 mm Number of attempts: 1 Airway Equipment and Method: Stylet Placement Confirmation: ETT inserted through vocal cords under direct vision, positive ETCO2 and breath sounds checked- equal and bilateral Secured at: 22 cm Tube secured with: Tape Dental Injury: Teeth and Oropharynx as per pre-operative assessment

## 2021-12-10 NOTE — Transfer of Care (Signed)
Immediate Anesthesia Transfer of Care Note  Patient: Douglas Lewis  Procedure(s) Performed: LUMBAR LAMINECTOMY/DECOMPRESSION  Lumbar Three- Four with removal of epidural hematoma (Back)  Patient Location: PACU  Anesthesia Type:General  Level of Consciousness: awake, alert  and oriented  Airway & Oxygen Therapy: Patient Spontanous Breathing  Post-op Assessment: Report given to RN and Post -op Vital signs reviewed and stable  Post vital signs: Reviewed and stable  Last Vitals:  Vitals Value Taken Time  BP 142/74 12/10/21 1708  Temp 36.2 C 12/10/21 1653  Pulse 81 12/10/21 1719  Resp 20 12/10/21 1719  SpO2 95 % 12/10/21 1719  Vitals shown include unvalidated device data.  Last Pain:  Vitals:   12/10/21 1653  TempSrc:   PainSc: 0-No pain      Patients Stated Pain Goal: 3 (34/28/76 8115)  Complications: No notable events documented.

## 2021-12-10 NOTE — Anesthesia Postprocedure Evaluation (Signed)
Anesthesia Post Note  Patient: Douglas Lewis  Procedure(s) Performed: LUMBAR LAMINECTOMY/DECOMPRESSION  Lumbar Three- Four with removal of epidural hematoma (Back)     Patient location during evaluation: PACU Anesthesia Type: General Level of consciousness: awake and alert Pain management: pain level controlled Vital Signs Assessment: post-procedure vital signs reviewed and stable Respiratory status: spontaneous breathing, nonlabored ventilation, respiratory function stable and patient connected to nasal cannula oxygen Cardiovascular status: blood pressure returned to baseline and stable Postop Assessment: no apparent nausea or vomiting Anesthetic complications: no   No notable events documented.  Last Vitals:  Vitals:   12/10/21 1722 12/10/21 1759  BP: (!) 149/86 (!) 162/86  Pulse: 82 84  Resp: (!) 21 20  Temp: 37.1 C 36.8 C  SpO2: 94% 95%    Last Pain:  Vitals:   12/10/21 1759  TempSrc: Oral  PainSc: 10-Worst pain ever                 Effie Berkshire
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
## 2021-12-10 NOTE — Brief Op Note (Signed)
12/10/2021  4:48 PM  PATIENT:  Douglas Lewis  66 y.o. male  PRE-OPERATIVE DIAGNOSIS:  Lumbar Epidural Hematoma, Stenosis  POST-OPERATIVE DIAGNOSIS:  Lumbar Epidural Hematoma, Stenosis  PROCEDURE:  Procedure(s): LUMBAR LAMINECTOMY/DECOMPRESSION  Lumbar Three- Four with removal of epidural hematoma (N/A)  SURGEON:  Surgeon(s) and Role:    Earnie Larsson, MD - Primary  PHYSICIAN ASSISTANT:   ASSISTANTSMearl Latin   ANESTHESIA:   general  EBL:  350 mL   BLOOD ADMINISTERED:none  DRAINS: none   LOCAL MEDICATIONS USED:  NONE  SPECIMEN:  No Specimen  DISPOSITION OF SPECIMEN:  N/A  COUNTS:  YES  TOURNIQUET:  * No tourniquets in log *  DICTATION: .Dragon Dictation  PLAN OF CARE: Admit to inpatient   PATIENT DISPOSITION:  PACU - hemodynamically stable.   Delay start of Pharmacological VTE agent (>24hrs) due to surgical blood loss or risk of bleeding: yes

## 2021-12-10 NOTE — Anesthesia Preprocedure Evaluation (Addendum)
Anesthesia Evaluation  Patient identified by MRN, date of birth, ID band Patient awake    Reviewed: Allergy & Precautions, NPO status , Patient's Chart, lab work & pertinent test results  Airway Mallampati: II  TM Distance: >3 FB Neck ROM: Full    Dental  (+) Edentulous Upper, Edentulous Lower   Pulmonary neg pulmonary ROS,    breath sounds clear to auscultation       Cardiovascular negative cardio ROS   Rhythm:Regular Rate:Normal     Neuro/Psych negative neurological ROS  negative psych ROS   GI/Hepatic negative GI ROS, Neg liver ROS,   Endo/Other  negative endocrine ROS  Renal/GU negative Renal ROS     Musculoskeletal negative musculoskeletal ROS (+)   Abdominal Normal abdominal exam  (+)   Peds  Hematology negative hematology ROS (+)   Anesthesia Other Findings   Reproductive/Obstetrics                            Anesthesia Physical Anesthesia Plan  ASA: 2 and emergent  Anesthesia Plan: General   Post-op Pain Management: Dilaudid IV and Ketamine IV   Induction: Intravenous  PONV Risk Score and Plan: 3 and Ondansetron, Dexamethasone and Midazolam  Airway Management Planned: Oral ETT  Additional Equipment: None  Intra-op Plan:   Post-operative Plan: Extubation in OR  Informed Consent: I have reviewed the patients History and Physical, chart, labs and discussed the procedure including the risks, benefits and alternatives for the proposed anesthesia with the patient or authorized representative who has indicated his/her understanding and acceptance.     Dental advisory given  Plan Discussed with: CRNA  Anesthesia Plan Comments:        Anesthesia Quick Evaluation

## 2021-12-11 ENCOUNTER — Encounter (HOSPITAL_COMMUNITY): Payer: Self-pay | Admitting: Neurosurgery

## 2021-12-11 NOTE — Progress Notes (Signed)
Postop day 1 for lumbar decompressive surgery.  Patient overall doing a little better.  Has significant back pain.  Reports increased strength and sensation in both legs but still has some numbness in his feet and perineal region.  Catheter remains in place.  Afebrile.  Vital signs are stable.  Drain output moderate.  Urine output good.  Awake and alert.  Oriented and appropriate.  5/5 strength bilateral hip flexors.  Quadriceps 4+/5.  Anterior tibialis and gastrocs 4+/5.  Patient has some sensation of the catheter but perineal sensation is diminished.  Dressing clean and dry.  Progressing reasonably well following decompressive surgery for severe stenosis with cauda equina syndrome.  Begin mobilization.  Continue catheter for now.
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
## 2021-12-12 ENCOUNTER — Encounter (HOSPITAL_COMMUNITY): Payer: Self-pay | Admitting: Neurosurgery

## 2021-12-12 MED ORDER — PANTOPRAZOLE SODIUM 40 MG PO TBEC
40.0000 mg | DELAYED_RELEASE_TABLET | Freq: Every day | ORAL | Status: DC
  Administered 2021-12-12 – 2021-12-15 (×4): 40 mg via ORAL
  Filled 2021-12-12 (×4): qty 1

## 2021-12-12 MED ORDER — TAMSULOSIN HCL 0.4 MG PO CAPS
0.4000 mg | ORAL_CAPSULE | Freq: Every day | ORAL | Status: DC
  Administered 2021-12-12 – 2021-12-16 (×5): 0.4 mg via ORAL
  Filled 2021-12-12 (×5): qty 1

## 2021-12-12 MED ORDER — FLEET ENEMA 7-19 GM/118ML RE ENEM
1.0000 | ENEMA | Freq: Every day | RECTAL | Status: DC | PRN
  Administered 2021-12-12: 1 via RECTAL
  Filled 2021-12-12: qty 1
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
## 2021-12-12 NOTE — Progress Notes (Signed)
Administered fleets enema. patient did not have a BM.

## 2021-12-12 NOTE — Progress Notes (Signed)
Inpatient Rehab Admissions Coordinator:   Met with pt at the bedside to discuss CIR goals/expectations.  Let him know that average length of stay on CIR is ~2 weeks, but depending on progress could be more, or less, with goals of discharge home as independently as possible.  Pt does not feel that he can reach independent level in that time frame (I let him know that I agreed he would likely need some help at discharge), and states that he does not have any support in this area, nor a home that he can go to.  States he works [place] and usually rents a room short term.  At this time he prefers SNF for longer term rehab.  Will sign off for CIR and let TOC team know.   Shann Medal, PT, DPT Admissions Coordinator [phone] 12/12/21  11:38 AM
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
## 2021-12-13 NOTE — TOC Progression Note (Signed)
Transition of Care [facility]) - Progression Note    Patient Details  Name: Douglas Lewis MRN: [id number] Date of Birth: November 07, 1955  Transition of Care [facility] CM/SW Contact  Pollie Friar, RN Phone Number: 12/13/2021, 11:27 AM  Clinical Narrative:    Patient requested [facility] for SNF. CM inquired with [facility] and they wont have a bed available until next week. CM has updated the patient and he has selected [facility].  CM has left voicemail at MD office to see when pt will be medically ready to start insurance auth.  TOC following.   Expected Discharge Plan: [place] Barriers to Discharge: Continued Medical Work up  Expected Discharge Plan and Services Expected Discharge Plan: [place] In-house Referral: Clinical Social Work Discharge Planning Services: CM Consult Post Acute Care Choice: [place] arrangements for the past 2 months: [place] Determinants of Health (SDOH) Interventions    Readmission Risk Interventions No flowsheet data found.
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
## 2021-12-13 NOTE — Progress Notes (Signed)
Paged the attending about patient complaint of constipation. Awaiting call back or orders.
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
## 2021-12-13 NOTE — Progress Notes (Signed)
OT Cancellation Note  Patient Details Name: Douglas Lewis MRN: [id number] DOB: 29-Mar-1956   Cancelled Treatment:    Reason Eval/Treat Not Completed: Patient at procedure or test/ unavailable;Other (comment) pt working with PT upon arrival, will check back as time allows for OT session.   Harley Alto., COTA/L Acute Rehabilitation Services [phone]   Precious Haws 12/13/2021, 9:12 AM

## 2021-12-14 LAB — CREATININE, SERUM
Creatinine, Ser: 0.75 mg/dL (ref 0.61–1.24)
GFR, Estimated: >60 mL/min (ref >60)

## 2021-12-14 MED ORDER — GABAPENTIN 600 MG PO TABS
300.0000 mg | ORAL_TABLET | Freq: Three times a day (TID) | ORAL | Status: DC
  Administered 2021-12-14 – 2021-12-16 (×5): 300 mg via ORAL
  Filled 2021-12-14 (×5): qty 1

## 2021-12-14 MED ORDER — OXYCODONE HCL 5 MG PO TABS
10.0000 mg | ORAL_TABLET | ORAL | Status: DC | PRN
  Administered 2021-12-14 – 2021-12-15 (×6): 10 mg via ORAL
  Filled 2021-12-14 (×6): qty 2
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
## 2021-12-15 MED ORDER — OXYCODONE HCL 5 MG PO TABS
7.5000 mg | ORAL_TABLET | ORAL | Status: DC | PRN
  Administered 2021-12-15 – 2021-12-16 (×6): 7.5 mg via ORAL
  Filled 2021-12-15 (×6): qty 2

## 2021-12-15 NOTE — TOC Progression Note (Signed)
Transition of Care [facility]) - Progression Note    Patient Details  Name: Douglas Lewis MRN: [id number] Date of Birth: 1956/03/14  Transition of [facility]) CM/SW Contact  Pollie Friar, RN Phone Number: 12/15/2021, 10:32 AM  Clinical Narrative:    MD is getting patient on PO pain meds and off the IV pain meds. Will start insurance auth for a Friday discharge and admit to [facility] per pt choice.  Odem says they do not need a new covid test as pt has no symptoms. TOC following.   Expected Discharge Plan: [place] Barriers to Discharge: Continued Medical Work up  Expected Discharge Plan and Services Expected Discharge Plan: [place] In-house Referral: Clinical Social Work Discharge Planning Services: CM Consult Post Acute Care Choice: [place] arrangements for the past 2 months: [place] Determinants of Health (SDOH) Interventions    Readmission Risk Interventions No flowsheet data found.
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
## 2021-12-16 ENCOUNTER — Emergency Department (HOSPITAL_COMMUNITY)
Admission: EM | Admit: 2021-12-16 | Discharge: 2021-12-16 | Disposition: A | Discharge status: Home / Self Care | Payer: 59 | Attending: Emergency Medicine | Admitting: Emergency Medicine

## 2021-12-16 ENCOUNTER — Encounter (HOSPITAL_COMMUNITY): Payer: Self-pay

## 2021-12-16 DIAGNOSIS — G8918 Other acute postprocedural pain: Secondary | ICD-10-CM

## 2021-12-16 DIAGNOSIS — R52 Pain, unspecified: Secondary | ICD-10-CM

## 2021-12-16 DIAGNOSIS — M549 Dorsalgia, unspecified: Secondary | ICD-10-CM | POA: Diagnosis not present

## 2021-12-16 MED ORDER — TAMSULOSIN HCL 0.4 MG PO CAPS: 0.4000 mg | ORAL_CAPSULE | Freq: Every day | ORAL | 2 refills | Status: AC

## 2021-12-16 MED ORDER — OXYCODONE-ACETAMINOPHEN 5-325 MG PO TABS
2.0000 | ORAL_TABLET | Freq: Once | ORAL | Status: AC
  Administered 2021-12-16: 2 via ORAL
  Filled 2021-12-16: qty 2

## 2021-12-16 MED ORDER — BISACODYL 10 MG RE SUPP: 10.0000 mg | Freq: Every day | RECTAL | 0 refills | Status: AC | PRN

## 2021-12-16 MED ORDER — GABAPENTIN 50 MG PO TABS: 300.0000 mg | ORAL_TABLET | Freq: Three times a day (TID) | ORAL | 1 refills | Status: AC

## 2021-12-16 MED ORDER — OXYCODONE HCL 7.5 MG PO TABS
7.5000 mg | ORAL_TABLET | ORAL | 0 refills | Status: DC | PRN
Start: 2021-12-16 — End: 2021-12-17

## 2021-12-16 MED ORDER — OXYCODONE HCL 7.5 MG PO TABS: 7.5000 mg | ORAL_TABLET | ORAL | 0 refills | Status: DC | PRN

## 2021-12-16 MED ORDER — SENNOSIDES-DOCUSATE SODIUM 8.6-50 MG PO TABS: 1.0000 | ORAL_TABLET | Freq: Two times a day (BID) | ORAL | Status: AC

## 2021-12-16 NOTE — ED Notes (Addendum)
Patient threatening staff stating "I am about to go the fuck off if you don't get me out of here". This RN explained that he has not been discharged patient states he would like to leave AMA. This RN attempting to explain risks and benefits, patient contiuing to escalte. Security called to assist.  Patient assisted into wheelchair. Patient screaming at this RN and security that "I will throw myself in front of a bus so you guys will give me medication and put me back in traction"  Patient continuing to scream at this RN about lack of pain medication prescription. This RN instructed patient to take already prescribed medication for pain control. Patient continues to threaten staff. Patient wheeled from unit by security.

## 2021-12-16 NOTE — Care Management Important Message (Signed)
Important Message  Patient Details  Name: Douglas Lewis MRN: [id number] Date of Birth: 12/30/1955   Medicare Important Message Given:  Yes     Hannah Beat 12/16/2021, 2:40 PM
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
## 2021-12-16 NOTE — ED Notes (Signed)
Patient screaming. This RN entered patients room to see why patient was screaming. Patient began threatening this RN to throw himself on the floor. Patient states "If I do not get food and pain medication I am going to throw myself on this fucking floor". Patient educated that throwing himself on the floor is not appropriate and could result in further injury. Patient states "I don't give a damn". Patient continuing to yell. This RN explained that prescribing pain medication is out of her scope of practice but this RN will make primary RN aware of patients request for pain medication. Patient continuing to yell and cuss at this RN. This RN left room for staff safety. Side rails upx2, slip-free socks on, call bell within reach.

## 2021-12-16 NOTE — Discharge Instructions (Signed)
Take your medications as prescribed. Follow-up with neurosurgery as recommended. Return for shortness of breath, fevers or new concerns.

## 2021-12-16 NOTE — Progress Notes (Signed)
Patient requesting for his 'bladder to be emptied'. When asked if patient is able to try to empty his bladder on his own he states that it hurts too much for him to do it on his own and what he does pee is just overflow from his bladder being too full. Patient informed that I am unable to perform and in and out catheter unless I first assess his bladder volume via a bladder scan. Patient states it is full and that they have just been doing them. Will perform a bladder scan and reach out to attending MD to determine if a foley would be more appropriate for patient if he is truly experiencing inability to empty his bladder spontaneously.

## 2021-12-16 NOTE — Progress Notes (Signed)
Report called to [facility]  

## 2021-12-16 NOTE — Social Work (Signed)
CSW called [facility] and spoke with RN Tammy who states that upon Pt's arrival at [facility], they were in fact out of his prescription pain medication, but that she put in a stat-delivery pharmacy request. Per Tammy, Pt's arrival time and the [facility] inpatient d/c chart would mean that Pt was not scheduled for pain medication as of the time he was at [facility]. Pt was scheduled to receive Gabapentin which he refused. Per Tammy, a  CNA had been sent to Pt's room upon arrival to check him in and inventory his belongings. Per, Tammy CNA had been in room at various times during the period when Pt states that there was no one in room.
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
## 2021-12-16 NOTE — TOC Transition Note (Addendum)
Transition of Care [facility]) - CM/SW Discharge Note   Patient Details  Name: Johnpatrick Jenny MRN: [id number] Date of Birth: 11-May-1956  [facility]) CM/SW Contact:  Pollie Friar, RN Phone Number: 12/16/2021, 10:10 AM   Clinical Narrative:    Patient is discharging to [facility] today. Pt will transport via ambulance. Bedside RN updated and discharge packet is at the desk.  Pt refused for CM to contact his family about discharge.   Room: 121a Number for report: [phone]   Final next level of care: Skilled Nursing Facility Barriers to Discharge: No Barriers Identified   Patient Goals and CMS Choice   CMS Medicare.gov Compare Post Acute Care list provided to:: Patient Choice offered to / list presented to : Patient  Discharge Placement PASRR number recieved: 12/14/21            Patient chooses bed at: [facility] Patient to be transferred to facility by: Ambulance Name of family member notified: patient aware Patient and family notified of of transfer: 12/16/21  Discharge Plan and Services In-house Referral: Clinical Social Work Discharge Planning Services: [employer] Consult Post Acute Care Choice: [place]                               Social Determinants of Health (SDOH) Interventions     Readmission Risk Interventions No flowsheet data found.

## 2021-12-16 NOTE — Plan of Care (Signed)

## 2021-12-16 NOTE — ED Notes (Signed)
Pt yelling at social worker and screaming that social worker is a "communist" verbally aggressive and screaming he will not leave the emergency department until "at least Monday".
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
## 2021-12-16 NOTE — Progress Notes (Signed)
CSW met with Pt at bedside and Pt still states that he will not return to [facility], states he would rather go home with [facility].  RNCM set up services with [facility]. Pt requested a rollator, one will be requested from [place] and will be shipped to Pt's home.

## 2021-12-17 ENCOUNTER — Telehealth: Payer: Self-pay | Admitting: Student

## 2021-12-17 DIAGNOSIS — S32030A Wedge compression fracture of third lumbar vertebra, initial encounter for closed fracture: Secondary | ICD-10-CM

## 2021-12-17 MED ORDER — OXYCODONE-ACETAMINOPHEN 10-325 MG PO TABS: 1.0000 | ORAL_TABLET | ORAL | 0 refills | Status: DC | PRN

## 2021-12-17 NOTE — Telephone Encounter (Signed)
Patient request pain medication as none was sent at discharge form ED or SNF.

## 2021-12-20 ENCOUNTER — Emergency Department (HOSPITAL_COMMUNITY)
Admission: EM | Admit: 2021-12-20 | Discharge: 2021-12-20 | Disposition: A | Discharge status: Home / Self Care | Payer: 59 | Attending: Emergency Medicine | Admitting: Emergency Medicine

## 2021-12-20 DIAGNOSIS — M545 Low back pain, unspecified: Secondary | ICD-10-CM | POA: Diagnosis present

## 2021-12-20 DIAGNOSIS — Z9889 Other specified postprocedural states: Secondary | ICD-10-CM | POA: Diagnosis not present

## 2021-12-20 DIAGNOSIS — D72829 Elevated white blood cell count, unspecified: Secondary | ICD-10-CM | POA: Insufficient documentation

## 2021-12-20 DIAGNOSIS — R202 Paresthesia of skin: Secondary | ICD-10-CM | POA: Diagnosis not present

## 2021-12-20 LAB — CBC WITH DIFFERENTIAL/PLATELET
Abs Immature Granulocytes: 0.05 10*3/uL (ref 0.00–0.07)
Basophils Absolute: 0.1 10*3/uL (ref 0.0–0.1)
Basophils Relative: 0 %
Eosinophils Absolute: 0.1 10*3/uL (ref 0.0–0.5)
Eosinophils Relative: 1 %
HCT: 38.3 % — ABNORMAL LOW (ref 39.0–52.0)
Hemoglobin: 12 g/dL — ABNORMAL LOW (ref 13.0–17.0)
Immature Granulocytes: 0 %
Lymphocytes Relative: 12 %
Lymphs Abs: 1.4 10*3/uL (ref 0.7–4.0)
MCH: 28.6 pg (ref 26.0–34.0)
MCHC: 31.3 g/dL (ref 30.0–36.0)
MCV: 91.4 fL (ref 80.0–100.0)
Monocytes Absolute: 0.6 10*3/uL (ref 0.1–1.0)
Monocytes Relative: 6 %
Neutro Abs: 9.2 10*3/uL — ABNORMAL HIGH (ref 1.7–7.7)
Neutrophils Relative %: 81 %
Platelets: 332 10*3/uL (ref 150–400)
RBC: 4.19 MIL/uL — ABNORMAL LOW (ref 4.22–5.81)
RDW: 14.7 % (ref 11.5–15.5)
WBC: 11.5 10*3/uL — ABNORMAL HIGH (ref 4.0–10.5)
nRBC: 0 % (ref 0.0–0.2)

## 2021-12-20 LAB — BASIC METABOLIC PANEL
Anion gap: 8 (ref 5–15)
BUN: 18 mg/dL (ref 8–23)
CO2: 25 mmol/L (ref 22–32)
Calcium: 8.9 mg/dL (ref 8.9–10.3)
Chloride: 103 mmol/L (ref 98–111)
Creatinine, Ser: 0.8 mg/dL (ref 0.61–1.24)
GFR, Estimated: >60 mL/min (ref >60)
Glucose, Bld: 82 mg/dL (ref 70–99)
Potassium: 4.6 mmol/L (ref 3.5–5.1)
Sodium: 136 mmol/L (ref 135–145)

## 2021-12-20 MED ORDER — OXYCODONE-ACETAMINOPHEN 5-325 MG PO TABS
1.0000 | ORAL_TABLET | Freq: Once | ORAL | Status: AC
  Administered 2021-12-20: 1 via ORAL
  Filled 2021-12-20: qty 1

## 2021-12-20 NOTE — ED Notes (Signed)
Pt stood up and got into wheelchair. Strong of feet. Stand by assist for safety with security. Pt had previously stated he could not walk. Pt wheeled to lobby by security and [employer].
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
## 2021-12-20 NOTE — ED Notes (Addendum)
Pt screamed for urinal. Pt provided urinal . Had 429mL urine output.

## 2021-12-20 NOTE — ED Notes (Signed)
[employer] at bedside.  Pt cursing and stating "take me to fucking jail, I am paralyzed, I cant piss and I cant shit. " Pt had informed this RN that he had urinated at 1100 12/20/2021. "I want my fucking pain pills. You better not fucking touch me.

## 2021-12-20 NOTE — ED Provider Triage Note (Signed)
Emergency Medicine Provider Triage Evaluation Note  Douglas Lewis , a 66 y.o. male  was evaluated in triage.  Pt complains of back pain.  Patient had back surgery about 2 weeks ago, was seen by Dr. Kathyrn Sheriff here in the ER 4 days ago.  He has not had a postop appointment outpatient yet.Patient patient reports she has not been able to feel his groin or urination since the surgery 2 weeks ago.  States she has to specifically because he is out of his pain medicine and in excruciating pain.  Review of Systems  Positive: BACK PAIN Negative:   Physical Exam  BP (!) 153/127 (BP Location: Left Arm)    Pulse 93    Temp 97.7 F (36.5 C) (Oral)    Resp 20    SpO2 100%  Gen:   Awake, no distress   Resp:  Normal effort  MSK:   Moves extremities without difficulty  Other:  Patient is agitated.  Not willing to engage in exam, when I can observe his facial nerves appear to be intact.  Was unable to test strength and weakness in the upper or lower extremity.  Medical Decision Making  Medically screening exam initiated at 2:14 PM.  Appropriate orders placed.  Douglas Lewis was informed that the remainder of the evaluation will be completed by another provider, this initial triage assessment does not replace that evaluation, and the importance of remaining in the ED until their evaluation is complete.  Patient's last MRI was on 2/4.  There is not appear to be an acute neurodeficit.  Patient will likely be more agreeable to in-depth neuro exam on back in room and medicated, will withhold from ordering imaging at this time.  This appears to have been ongoing for the last 2 weeks so does not appear to be an acute cauda equina type presentation.   Sherrill Raring, PA-C 12/20/21 1414

## 2021-12-20 NOTE — Discharge Instructions (Addendum)
Your labs today are reassuring and the numbness you are concerned about is an unfortunate side effect of your surgery that will take some time to fully resolve. We placed you in a facility while you heal, but if you want to go to one in [place], unfortunately you will need to work that out with your primary doctor or neurosurgeon as we can not do that here in the emergency department.
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
## 2021-12-20 NOTE — ED Notes (Signed)
Pt informed of d/c. Pt immediatly began yelling and cursing. Security called and present at bedside. Pt stated, he is protesting his discharge and refuses to leave. Pt continued that the hospital isnt taking care of him. Pt was informed by this RN and Danae Chen PA that he does not warrant admission and he needs to follow up with neurosurgery. Pt states he will not leave without pain medication. Procedure was again explained to pt. During this time, pt was attempting to video staff. Pt was informed videoing was not allowed. Pt stated he did not care. Partition was placed. Sabrina RN also at bedside, explaining next steps. Pt continued yelling and cursing. [employer] was called to have pt removed from premise.

## 2024-09-16 ENCOUNTER — Encounter (HOSPITAL_BASED_OUTPATIENT_CLINIC_OR_DEPARTMENT_OTHER): Payer: Self-pay

## 2024-09-16 ENCOUNTER — Emergency Department (HOSPITAL_BASED_OUTPATIENT_CLINIC_OR_DEPARTMENT_OTHER)
Admission: EM | Admit: 2024-09-16 | Discharge: 2024-09-16 | Disposition: A | Discharge status: Home / Self Care | Attending: Emergency Medicine | Admitting: Emergency Medicine

## 2024-09-16 ENCOUNTER — Other Ambulatory Visit: Payer: Self-pay

## 2024-09-16 ENCOUNTER — Emergency Department (HOSPITAL_BASED_OUTPATIENT_CLINIC_OR_DEPARTMENT_OTHER)

## 2024-09-16 ENCOUNTER — Other Ambulatory Visit (HOSPITAL_BASED_OUTPATIENT_CLINIC_OR_DEPARTMENT_OTHER): Payer: Self-pay

## 2024-09-16 ENCOUNTER — Emergency Department (HOSPITAL_BASED_OUTPATIENT_CLINIC_OR_DEPARTMENT_OTHER): Admitting: Radiology

## 2024-09-16 DIAGNOSIS — G8929 Other chronic pain: Secondary | ICD-10-CM | POA: Diagnosis not present

## 2024-09-16 DIAGNOSIS — S60211A Contusion of right wrist, initial encounter: Secondary | ICD-10-CM | POA: Insufficient documentation

## 2024-09-16 DIAGNOSIS — W4909XA Other specified item causing external constriction, initial encounter: Secondary | ICD-10-CM | POA: Diagnosis not present

## 2024-09-16 DIAGNOSIS — M545 Low back pain, unspecified: Secondary | ICD-10-CM | POA: Diagnosis not present

## 2024-09-16 DIAGNOSIS — S6991XA Unspecified injury of right wrist, hand and finger(s), initial encounter: Secondary | ICD-10-CM | POA: Diagnosis present

## 2024-09-16 MED ORDER — OXYCODONE-ACETAMINOPHEN 5-325 MG PO TABS
1.0000 | ORAL_TABLET | Freq: Once | ORAL | Status: AC
  Administered 2024-09-16: 1 via ORAL
  Filled 2024-09-16: qty 1

## 2024-09-16 MED ORDER — OXYCODONE-ACETAMINOPHEN 5-325 MG PO TABS
1.0000 | ORAL_TABLET | Freq: Four times a day (QID) | ORAL | 0 refills | Status: AC | PRN
  Filled 2024-09-16: qty 8, 2d supply, fill #0

## 2024-09-16 NOTE — Discharge Instructions (Addendum)
 Your CT does show findings that may correlate with loosening of screws from your prior back surgeries, please follow-up with your neurosurgeon to discuss this further.  Continue Percocet, 1 tablet by mouth every 6 hours as needed for severe pain.  I have provided you with contact information for [facility], please contact their office to establish care since you do not have a primary care provider locally.  Return to the emergency department if your symptoms worsen.

## 2024-09-16 NOTE — ED Notes (Signed)
 ED Provider at bedside.

## 2024-09-16 NOTE — ED Notes (Signed)
 Patient transported to X-ray

## 2024-09-16 NOTE — ED Notes (Signed)
 Reviewed AVS/discharge instruction with patient. Time allotted for and all questions answered. Patient is agreeable for d/c and escorted to ed exit by staff.
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
## 2024-09-18 ENCOUNTER — Ambulatory Visit (HOSPITAL_COMMUNITY): Admission: EM | Admit: 2024-09-18 | Discharge: 2024-09-18 | Disposition: A | Discharge status: Home / Self Care

## 2024-09-18 DIAGNOSIS — F1910 Other psychoactive substance abuse, uncomplicated: Secondary | ICD-10-CM | POA: Diagnosis not present

## 2024-09-18 DIAGNOSIS — F419 Anxiety disorder, unspecified: Secondary | ICD-10-CM | POA: Insufficient documentation

## 2024-09-18 DIAGNOSIS — F1120 Opioid dependence, uncomplicated: Secondary | ICD-10-CM | POA: Diagnosis not present

## 2024-09-18 DIAGNOSIS — M5459 Other low back pain: Secondary | ICD-10-CM | POA: Insufficient documentation

## 2024-09-18 DIAGNOSIS — Z5902 Unsheltered homelessness: Secondary | ICD-10-CM | POA: Diagnosis not present

## 2024-09-18 DIAGNOSIS — S32009S Unspecified fracture of unspecified lumbar vertebra, sequela: Secondary | ICD-10-CM | POA: Diagnosis not present

## 2024-09-18 DIAGNOSIS — F909 Attention-deficit hyperactivity disorder, unspecified type: Secondary | ICD-10-CM | POA: Insufficient documentation

## 2024-09-18 DIAGNOSIS — F1110 Opioid abuse, uncomplicated: Secondary | ICD-10-CM | POA: Diagnosis present

## 2024-09-18 NOTE — ED Notes (Signed)
 Patient Is discharging at this time. Printed AVS reviewed with patient by provider. Valuables/belongings returned to patient.

## 2024-09-18 NOTE — Discharge Instructions (Addendum)
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
